# Patient Record
Sex: Female | Born: 1950
Health system: Southern US, Community
[De-identification: ages and names within clinical notes are randomized; demographics above are authoritative.]

## PROBLEM LIST (undated history)

## (undated) DIAGNOSIS — F419 Anxiety disorder, unspecified: Secondary | ICD-10-CM

## (undated) DIAGNOSIS — E7800 Pure hypercholesterolemia, unspecified: Secondary | ICD-10-CM

## (undated) DIAGNOSIS — R0602 Shortness of breath: Secondary | ICD-10-CM

## (undated) DIAGNOSIS — M1990 Unspecified osteoarthritis, unspecified site: Secondary | ICD-10-CM

## (undated) DIAGNOSIS — I471 Supraventricular tachycardia, unspecified: Secondary | ICD-10-CM

## (undated) DIAGNOSIS — I10 Essential (primary) hypertension: Secondary | ICD-10-CM

## (undated) DIAGNOSIS — J45909 Unspecified asthma, uncomplicated: Secondary | ICD-10-CM

## (undated) DIAGNOSIS — J449 Chronic obstructive pulmonary disease, unspecified: Secondary | ICD-10-CM

## (undated) HISTORY — PX: CARDIAC CATHETERIZATION: SHX172

## (undated) HISTORY — DX: Pure hypercholesterolemia, unspecified: E78.00

## (undated) HISTORY — PX: DILATION AND CURETTAGE OF UTERUS: SHX78

## (undated) HISTORY — DX: Chronic obstructive pulmonary disease, unspecified: J44.9

## (undated) HISTORY — PX: TUBAL LIGATION: SHX77

## (undated) HISTORY — DX: Supraventricular tachycardia, unspecified: I47.10

## (undated) HISTORY — DX: Essential (primary) hypertension: I10

## (undated) HISTORY — PX: HYSTEROSCOPY: SHX211

## (undated) HISTORY — PX: HERNIA REPAIR: SHX51

## (undated) HISTORY — DX: Supraventricular tachycardia: I47.1

---

## 1998-11-18 ENCOUNTER — Other Ambulatory Visit: Admission: RE | Admit: 1998-11-18 | Discharge: 1998-11-18 | Payer: Self-pay | Admitting: Obstetrics and Gynecology

## 1998-12-09 ENCOUNTER — Emergency Department (HOSPITAL_COMMUNITY): Admission: EM | Admit: 1998-12-09 | Discharge: 1998-12-10 | Payer: Self-pay

## 1998-12-10 ENCOUNTER — Encounter: Payer: Self-pay | Admitting: Emergency Medicine

## 1998-12-10 ENCOUNTER — Ambulatory Visit: Admission: RE | Admit: 1998-12-10 | Discharge: 1998-12-10 | Payer: Self-pay | Admitting: Emergency Medicine

## 1998-12-19 ENCOUNTER — Other Ambulatory Visit: Admission: RE | Admit: 1998-12-19 | Discharge: 1998-12-19 | Payer: Self-pay | Admitting: Obstetrics and Gynecology

## 1999-12-06 ENCOUNTER — Encounter: Admission: RE | Admit: 1999-12-06 | Discharge: 1999-12-06 | Payer: Self-pay | Admitting: Obstetrics and Gynecology

## 1999-12-06 ENCOUNTER — Encounter: Payer: Self-pay | Admitting: Obstetrics and Gynecology

## 1999-12-20 ENCOUNTER — Other Ambulatory Visit: Admission: RE | Admit: 1999-12-20 | Discharge: 1999-12-20 | Payer: Self-pay | Admitting: Obstetrics and Gynecology

## 2002-03-18 ENCOUNTER — Encounter: Admission: RE | Admit: 2002-03-18 | Discharge: 2002-03-18 | Payer: Self-pay | Admitting: Dermatology

## 2002-03-18 ENCOUNTER — Encounter: Payer: Self-pay | Admitting: Family Medicine

## 2002-04-21 ENCOUNTER — Ambulatory Visit (HOSPITAL_COMMUNITY): Admission: RE | Admit: 2002-04-21 | Discharge: 2002-04-21 | Payer: Self-pay | Admitting: Family Medicine

## 2002-04-21 ENCOUNTER — Encounter: Payer: Self-pay | Admitting: Family Medicine

## 2003-02-16 ENCOUNTER — Other Ambulatory Visit: Admission: RE | Admit: 2003-02-16 | Discharge: 2003-02-16 | Payer: Self-pay | Admitting: *Deleted

## 2003-03-19 ENCOUNTER — Ambulatory Visit (HOSPITAL_COMMUNITY): Admission: RE | Admit: 2003-03-19 | Discharge: 2003-03-19 | Payer: Self-pay | Admitting: *Deleted

## 2003-03-19 ENCOUNTER — Encounter (INDEPENDENT_AMBULATORY_CARE_PROVIDER_SITE_OTHER): Payer: Self-pay | Admitting: *Deleted

## 2003-03-22 ENCOUNTER — Encounter: Payer: Self-pay | Admitting: Family Medicine

## 2003-03-22 ENCOUNTER — Encounter: Admission: RE | Admit: 2003-03-22 | Discharge: 2003-03-22 | Payer: Self-pay | Admitting: Family Medicine

## 2003-12-20 ENCOUNTER — Ambulatory Visit (HOSPITAL_COMMUNITY): Admission: RE | Admit: 2003-12-20 | Discharge: 2003-12-20 | Payer: Self-pay | Admitting: *Deleted

## 2003-12-20 ENCOUNTER — Encounter (INDEPENDENT_AMBULATORY_CARE_PROVIDER_SITE_OTHER): Payer: Self-pay | Admitting: Specialist

## 2004-03-16 ENCOUNTER — Encounter: Admission: RE | Admit: 2004-03-16 | Discharge: 2004-03-16 | Payer: Self-pay | Admitting: *Deleted

## 2004-06-13 ENCOUNTER — Ambulatory Visit (HOSPITAL_BASED_OUTPATIENT_CLINIC_OR_DEPARTMENT_OTHER): Admission: RE | Admit: 2004-06-13 | Discharge: 2004-06-13 | Payer: Self-pay

## 2004-06-13 ENCOUNTER — Ambulatory Visit (HOSPITAL_COMMUNITY): Admission: RE | Admit: 2004-06-13 | Discharge: 2004-06-13 | Payer: Self-pay

## 2005-10-01 ENCOUNTER — Encounter: Admission: RE | Admit: 2005-10-01 | Discharge: 2005-10-01 | Payer: Self-pay | Admitting: Family Medicine

## 2006-11-18 ENCOUNTER — Encounter: Admission: RE | Admit: 2006-11-18 | Discharge: 2006-11-18 | Payer: Self-pay | Admitting: Family Medicine

## 2007-01-23 ENCOUNTER — Ambulatory Visit: Payer: Self-pay | Admitting: Internal Medicine

## 2007-01-31 ENCOUNTER — Ambulatory Visit: Payer: Self-pay | Admitting: Internal Medicine

## 2010-11-28 ENCOUNTER — Other Ambulatory Visit (HOSPITAL_COMMUNITY): Payer: Self-pay | Admitting: Family Medicine

## 2010-11-28 DIAGNOSIS — M545 Low back pain, unspecified: Secondary | ICD-10-CM

## 2010-11-28 DIAGNOSIS — R200 Anesthesia of skin: Secondary | ICD-10-CM

## 2010-12-04 ENCOUNTER — Other Ambulatory Visit (HOSPITAL_COMMUNITY): Payer: Self-pay

## 2010-12-07 ENCOUNTER — Ambulatory Visit (HOSPITAL_COMMUNITY)
Admission: RE | Admit: 2010-12-07 | Discharge: 2010-12-07 | Disposition: A | Discharge status: Home / Self Care | Payer: Self-pay | Source: Ambulatory Visit | Attending: Family Medicine | Admitting: Family Medicine

## 2010-12-07 DIAGNOSIS — R05 Cough: Secondary | ICD-10-CM | POA: Insufficient documentation

## 2010-12-07 DIAGNOSIS — J988 Other specified respiratory disorders: Secondary | ICD-10-CM | POA: Insufficient documentation

## 2010-12-07 DIAGNOSIS — R059 Cough, unspecified: Secondary | ICD-10-CM | POA: Insufficient documentation

## 2010-12-07 DIAGNOSIS — R062 Wheezing: Secondary | ICD-10-CM | POA: Insufficient documentation

## 2010-12-07 DIAGNOSIS — R0609 Other forms of dyspnea: Secondary | ICD-10-CM | POA: Insufficient documentation

## 2010-12-07 DIAGNOSIS — R0989 Other specified symptoms and signs involving the circulatory and respiratory systems: Secondary | ICD-10-CM | POA: Insufficient documentation

## 2010-12-07 DIAGNOSIS — Z87891 Personal history of nicotine dependence: Secondary | ICD-10-CM | POA: Insufficient documentation

## 2010-12-29 NOTE — Op Note (Signed)
NAME:  Brandy Scott, Brandy Scott              ACCOUNT NO.:  [id number]   MEDICAL RECORD NO.:  [id number]          PATIENT TYPE:  AMB   LOCATION:  [facility]                         FACILITY:  [facility]   PHYSICIAN:  Lorre Munroe., M.D.DATE OF BIRTH:  May 19, 1951   DATE OF PROCEDURE:  06/13/2004  DATE OF DISCHARGE:                                 OPERATIVE REPORT   PREOPERATIVE DIAGNOSES:  Umbilical hernia.   POSTOPERATIVE DIAGNOSES:  Umbilical hernia.   OPERATION:  Repair of umbilical hernia.   SURGEON:  Lebron Conners, M.D.   ANESTHESIA:  General and local.   DESCRIPTION OF PROCEDURE:  After the patient was monitored and had general  anesthesia and routine preparation and draping of the abdomen, I made a  short transverse incision just below the umbilicus over the palpable hernia.  After getting hemostasis in the skin and superficial subcutaneous tissues, I  separated the hernia sac and its contents from the surrounding subcutaneous  tissues and umbilical skin.  Then I slightly enlarged the whole in the  fascia and reduced the hernia contents. I could not tell whether the  contents were entirely preperitoneal fat but there was some appearance to me  of omentum as well.  I then sutured closed the hernia defect with running #0  Prolene suture.  I then mobilized the umbilical skin and surrounding  subcutaneous tissues off the repair for about 2 cm in all directions. I  fashioned a small patch of polypropylene mesh to fit over the repair and I  sewed that down with running basting 2-0 Prolene suture and I felt that this  repair reinforcement was adequate. I then used a long acting local  anesthetic to anesthetize the deep and superficial tissues. I sewed the  umbilical skin down to the central part of the mesh repair with a single  suture of 4-0 Vicryl and then closed the skin incision with running  intracuticular 4-0 Vicryl reinforced by Steri-Strips.  I applied a bulky  slightly compressive  bandage. The patient tolerated the operation well.      WB/MEDQ  D:  06/13/2004  T:  06/13/2004  Job:  [id number]

## 2010-12-29 NOTE — H&P (Signed)
NAME:  Brandy Scott, STANGELO                        ACCOUNT NO.:  [id number]   MEDICAL RECORD NO.:  [id number]                   PATIENT TYPE:  AMB   LOCATION:  [facility]                                  FACILITY:  [facility]   PHYSICIAN:  Achille B. Earlene Plater, M.D.               DATE OF BIRTH:  Apr 20, 1951   DATE OF ADMISSION:  DATE OF DISCHARGE:                                HISTORY & PHYSICAL   CHIEF COMPLAINT:  Postmenopausal bleeding.   HISTORY OF PRESENT ILLNESS:  A 60 year old white female initially seen at  the request of Dr. Laurann Montana for evaluation of postmenopausal bleeding.  Had been menopausal for the last 18 months and over the last six weeks  developed irregular bleeding.  Had no associated pain or other aggravating  or alleviating factors noted.  Amount of bleeding was mild.   Pelvic ultrasound in the office showed a thickened endometrial stripe  without focal mass.  Endometrial biopsy showed benign proliferative  endometrium.  Given the thickening of the endometrium, saline sonogram was  performed which shows a 1.4 cm apparent fundal endometrial polyp.  The  patient presents today for hysteroscopy D&C for further evaluation and  potential treatment.   PAST MEDICAL HISTORY:  Hypertension, cervical polyp, and obesity.   SURGICAL HISTORY:  Tubal ligation.   MEDICATIONS:  Maxzide and atenolol.   ALLERGIES:  None.   SOCIAL HISTORY:  Less than one-half pack per day smoker.  No alcohol or  other drugs.   FAMILY HISTORY:  Noncontributory.   REVIEW OF SYSTEMS:  Otherwise negative.   PHYSICAL EXAMINATION:  VITAL SIGNS:  Blood pressure 114/80, pulse 72, weight  257.25.  GENERAL:  Alert and oriented, no acute distress.  SKIN:  Warm and dry, no lesions.  HEART:  Regular rate and rhythm.  LUNGS:  Clear to auscultation.  ABDOMEN:  Liver and spleen normal, no hernia.  LYMPH NODE SURVEY:  Negative axilla and groin.  PELVIC:  Normal external genitalia, vagina and cervix normal.  Uterus  is  normal size.  It sounded to 7 cm on endometrial biopsy.  No adnexal masses  palpable.   Ultrasound results as outlined above.   ASSESSMENT:  Postmenopausal bleeding, suggestion of endometrial polyp on  saline infusion ultrasound.   PLAN:  Hysteroscopy D&C and polyp removal.  Operative risks discussed  including infection, bleeding, uterine perforation, and damage to  surrounding organs.  All questions answered; the patient wishes to proceed.                                               Gerri Spore B. Earlene Plater, M.D.    WBD/MEDQ  D:  03/16/2003  T:  03/16/2003  Job:  [id number]   cc:   Stacie Acres. White, M.D.  [address]  [address]  [place]  [place] [address]  Fax: [phone]

## 2010-12-29 NOTE — H&P (Signed)
NAME:  Brandy Scott, RAUTH                        ACCOUNT NO.:  [id number]   MEDICAL RECORD NO.:  [id number]                   PATIENT TYPE:  AMB   LOCATION:  [facility]                                  FACILITY:  [facility]   PHYSICIAN:  Patterson B. Earlene Plater, M.D.               DATE OF BIRTH:  [birth year]   DATE OF ADMISSION:  DATE OF DISCHARGE:                                HISTORY & PHYSICAL   DATE OF PROCEDURE:  Dec 20, 2003   PREOPERATIVE DIAGNOSIS:  Postmenopausal bleeding, history of endometrial  polyp, ultrasound suggestive of another endometrial polyp.   INTENDED PROCEDURE:  Hysteroscopy with polyp removal.   HISTORY OF PRESENT ILLNESS:  A 60 year old postmenopausal patient with a  history of recent episode of bleeding.  Ultrasound in the office suggestive  of an isolated endometrial polyp.  She has a history of the same in the past  and defers saline infusion ultrasound, rather to proceed directly with  hysteroscopy.   PAST MEDICAL HISTORY:  1. Hypertension.  2. Endometrial polyps.  3. Obesity.   SURGICAL HISTORY:  1. Tubal ligation.  2. Hysteroscopy.   MEDICATIONS:  1. Maxzide.  2. Atenolol.   ALLERGIES:  None.   SOCIAL HISTORY:  The patient continues to smoke.  No alcohol or other drugs.   FAMILY HISTORY:  Noncontributory.   REVIEW OF SYSTEMS:  Otherwise negative.   PHYSICAL EXAMINATION:  VITAL SIGNS:  Blood pressure 136/86, pulse 72, weight  253.  GENERAL:  Alert and oriented, no acute distress.  SKIN:  Warm and dry, no lesions.  HEART:  Regular rate and rhythm.  LUNGS:  Clear to auscultation.  ABDOMEN:  Liver and spleen normal, no hernia.  PELVIC:  Normal external genitalia, vagina and cervix normal.  The cervix is  mildly stenotic.  The uterus is nontender and anteverted, normal size, no  adnexal masses.   ASSESSMENT:  Postmenopausal bleeding, history of endometrial polyp.   PLAN:  Hysteroscopy with polyp removal.  Operative risks discussed including  infection;  bleeding; damage to bowel, bladder, surrounding organs; and fluid  overload.  All questions answered.  The patient wishes to proceed.                                               Gerri Spore B. Earlene Plater, M.D.    WBD/MEDQ  D:  12/17/2003  T:  12/17/2003  Job:  [id number]

## 2010-12-29 NOTE — Op Note (Signed)
NAME:  Brandy Scott, Brandy Scott                        ACCOUNT NO.:  [id number]   MEDICAL RECORD NO.:  [id number]                   PATIENT TYPE:  AMB   LOCATION:  [facility]                                  [facility]ACILITY:  [facility]   PHYSICIAN:  Sabin B. Earlene Plater, M.D.               DATE OF BIRTH:  1951-02-09   DATE OF PROCEDURE:  12/20/2003  DATE OF DISCHARGE:                                 OPERATIVE REPORT   PREOPERATIVE DIAGNOSES:  1. Postmenopausal bleeding.  2. Possible endometrial polyp.   POSTOPERATIVE DIAGNOSES:  1. Postmenopausal bleeding.  2. Possible endometrial polyp.   PROCEDURE:  Hysteroscopy D&C, polyp removal.   SURGEON:  Benton B. Earlene Plater, M.D.   ANESTHESIA:  LMA general.   FINDINGS:  Endometrial curettings.   FLUID DEFICIT:  20 mL Sorbitol.   ESTIMATED BLOOD LOSS:  Less than 50 mL.   COMPLICATIONS:  None.   INDICATIONS FOR PROCEDURE:  Patient with history of postmenopausal bleeding  and previous history of endometrial polyp. Ultrasound suggested a recurrent  endometrial polyp.   DESCRIPTION OF PROCEDURE:  The patient taken to the operating room and LMA  general anesthesia obtained.  She was placed in the [place] stirrups and  prepped and draped in the standard fashion.  Bladder emptied with red rubber  catheter.   Examination under anesthesia showed anteverted uterus that did not feel  enlarged.  There were no adnexal masses palpable.  Examination was somewhat  limited by her morbid obesity.   Speculum inserted.  Paracervical block placed with 10 mL 1% Nesacaine.   Single-tooth tenaculum was attached to the anterior lip of the cervix and  the cervix easily dilated to #21.   The diagnostic hysteroscope was inserted after being flushed with Sorbitol.  With good uterine distention, the cavity was inspected with the above  findings noted.   The polyp was removed with the Randall stone forceps and the endometrium  gently curetted with a serrated curette.   The scope was  reinserted and no other focal abnormalities were identified.  Therefore the procedure was terminated.   The single-tooth tenaculum was removed and the cervix hemostatic.  The  patient was taken to the recovery room awake, alert and in stable condition.                                               Gerri Spore B. Earlene Plater, M.D.    WBD/MEDQ  D:  12/20/2003  T:  12/20/2003  Job:  [id number]

## 2010-12-29 NOTE — Op Note (Signed)
   NAME:  Brandy Scott, Brandy Scott                        ACCOUNT NO.:  [id number]   MEDICAL RECORD NO.:  [id number]                   PATIENT TYPE:  AMB   LOCATION:  [facility]                                  [facility]ACILITY:  [facility]   PHYSICIAN:  Berlin B. Earlene Plater, M.D.               DATE OF BIRTH:  June 12, 1951   DATE OF PROCEDURE:  03/19/2003  DATE OF DISCHARGE:                                 OPERATIVE REPORT   PREOPERATIVE DIAGNOSES:  1. Postmenopausal bleeding.  2. Endometrial polyp.   POSTOPERATIVE DIAGNOSES:  1. Postmenopausal bleeding.  2. Endometrial polyp.   PROCEDURE:  Hysteroscopy, dilatation and curettage, and polypectomy.   SURGEON:  Chester Holstein. Earlene Plater, M.D.   ANESTHESIA:  LMA general.   FINDINGS:  Atrophic endometrium and endometrial polyp.   SPECIMENS:  Endometrial polyp and endometrial curettings.   ESTIMATED BLOOD LOSS:  Less than 50 mL.   FLUID DEFICIT:  20 mL sorbitol.   COMPLICATIONS:  None.   INDICATIONS:  Patient with a history of postmenopausal bleeding.  Pelvic  ultrasound and subsequent saline infusion ultrasound suggestive of an  endometrial polyp.   DESCRIPTION OF PROCEDURE:  The patient was taken to the operating room and  LMA general anesthesia obtained.  She was placed in the ski position and  prepped and draped in the standard fashion.  Bladder entered with a red  rubber catheter.  Exam under anesthesia showed a normal-size mobile uterus,  no adnexal masses palpable.   Speculum inserted, single-tooth tenaculum attached to the anterior lip of  the cervix.  The cervix easily dilated to a #21.  The diagnostic  hysteroscope was inserted after being flushed with sorbitol with good  uterine distention.  A single endometrial polyp was noted to be arising from  the fundus in the midline.  No other focal abnormalities were seen.  The  polyp was removed with the Randall stone forceps and the endometrium gently  curetted and sent as a separate specimen.  The scope was  reinserted and no  other abnormalities noted; therefore, the procedure was terminated.   Instruments were removed, cervix was hemostatic.   The patient tolerated the procedure well.  There were no complications.  She  was taken to the recovery room awake, alert, and in stable condition.                                               Gerri Spore B. Earlene Plater, M.D.    WBD/MEDQ  D:  03/19/2003  T:  03/20/2003  Job:  [id number]

## 2011-03-14 ENCOUNTER — Encounter: Payer: Self-pay | Admitting: Internal Medicine

## 2011-03-15 ENCOUNTER — Encounter: Payer: Self-pay | Admitting: Internal Medicine

## 2011-03-15 ENCOUNTER — Ambulatory Visit (INDEPENDENT_AMBULATORY_CARE_PROVIDER_SITE_OTHER): Payer: Self-pay | Admitting: Internal Medicine

## 2011-03-15 VITALS — BP 126/88 | HR 70 | Temp 98.1°F | Ht 66.0 in | Wt 252.2 lb

## 2011-03-15 DIAGNOSIS — R0609 Other forms of dyspnea: Secondary | ICD-10-CM

## 2011-03-15 DIAGNOSIS — J449 Chronic obstructive pulmonary disease, unspecified: Secondary | ICD-10-CM | POA: Insufficient documentation

## 2011-03-15 DIAGNOSIS — R05 Cough: Secondary | ICD-10-CM | POA: Insufficient documentation

## 2011-03-15 DIAGNOSIS — R0989 Other specified symptoms and signs involving the circulatory and respiratory systems: Secondary | ICD-10-CM

## 2011-03-15 DIAGNOSIS — R059 Cough, unspecified: Secondary | ICD-10-CM

## 2011-03-15 DIAGNOSIS — R0600 Dyspnea, unspecified: Secondary | ICD-10-CM

## 2011-03-15 NOTE — Assessment & Plan Note (Signed)
Copd, GERD likely etiologies. Noted she is on fish oil. Will finish dyspnea workup and then reassess

## 2011-03-15 NOTE — Patient Instructions (Signed)
Please have breathing test called FULL PFT After you have test, call our office to let me know that test is done Within a week or so I will get back to you about results Depending on test results I will tell you to come in for visit or have a pulmonary stress test

## 2011-03-15 NOTE — Progress Notes (Signed)
Subjective:    Patient ID: Brandy Scott, female    DOB: [birth year], 60 y.o.   MRN: [id number]  HPI  60 year old obese female, ex 42 pack smoker (quit oct 2011). Since then reports insidious onset of dyspnea. Progressive since onset. Dyspnea brought on by exertional activities like walking to mail box and back on incline, finishing shower. No longer able to husband in  [place]. Always relieved by rest but now has dyspnea occasional at rest. Dyspnea rated as moderate.   There is associated cough. Insidious onset. STarted few years ago as dry cough even before she quit smoking. After quitting smoking, cough is not worse but now associaed with sputum which is normall white but occasionally with greenish tint. Admits to occ GERD for which she takes tums prn (she is on fish oil but denies this is provoking gerd). Denies sinus drainage. No ACE inhibitor use (is on ARB  - losartan).   There is also associated wheeze and chest heaviness but no hemoptysis, syncope, chest pains, edema, orthopnea, paroxysmal nocturnal dyspnea. Unsure if she snores at night but does admit to easy excess day time somnolence. Denies family hx of COPD/asthma  She had spirometry 12/07/2010 - suggests moderate restriction: Fev1 1.77L/61%, Ratio 74 (78), small airways 1.56/60%. However, there is 9%/210cc improvement on FVC with bronchodilator. Flow volume loops esp exp phase are blunted peak flow and bit erratci  Trials of spiriva and dulera each 1 month have not helped. CXR per hx of patient last month was normal. Walking desat test in office 185 feet x 3 laps - no desaturation.  Past Medical History  Diagnosis Date  . Hypercholesteremia   . HTN (hypertension)   . Diabetes mellitus   . Gout   . COPD (chronic obstructive pulmonary disease)      Family History  Problem Relation Age of Onset  . Hypertension Father   . Diabetes Father   . Parkinsonism Father   . Colon cancer Mother   . Alzheimer's disease Mother   .  Clotting disorder Mother     had filter placed     History   Social History  . Marital Status: Married    Spouse Name: N/A    Number of Children: 2  . Years of Education: N/A   Occupational History  . unemployed    Social History Main Topics  . Smoking status: Former Smoker -- 1.0 packs/day for 42 years    Types: Cigarettes    Quit date: 05/14/2010  . Smokeless tobacco: Not on file  . Alcohol Use: No  . Drug Use: No  . Sexually Active: Not on file   Other Topics Concern  . Not on file   Social History Narrative  . No narrative on file     No Known Allergies   Outpatient Prescriptions Prior to Visit  Medication Sig Dispense Refill  . aspirin 81 MG tablet Take 81 mg by mouth daily.        . fish oil-omega-3 fatty acids 1000 MG capsule Take 2 g by mouth daily.        . hydrochlorothiazide 25 MG tablet Take 25 mg by mouth daily.        Marland Kitchen losartan (COZAAR) 100 MG tablet Take 50 mg by mouth daily.        . metFORMIN (GLUCOPHAGE) 1000 MG tablet Take 1,000 mg by mouth daily with breakfast.        . Mometasone Furo-Formoterol Fum (DULERA) 200-5 MCG/ACT AERO  Inhale 2 puffs into the lungs 2 (two) times daily.                 Review of Systems  Constitutional: Negative for fever and unexpected weight change.  HENT: Positive for ear pain. Negative for nosebleeds, congestion, sore throat, rhinorrhea, sneezing, trouble swallowing, dental problem, postnasal drip and sinus pressure.   Eyes: Negative for redness and itching.  Respiratory: Positive for cough and shortness of breath. Negative for chest tightness and wheezing.   Cardiovascular: Positive for leg swelling. Negative for palpitations.  Gastrointestinal: Negative for nausea and vomiting.  Genitourinary: Negative for dysuria.  Musculoskeletal: Positive for joint swelling.  Skin: Negative for rash.  Neurological: Negative for headaches.  Hematological: Does not bruise/bleed easily.  Psychiatric/Behavioral: Negative  for dysphoric mood. The patient is not nervous/anxious.        Objective:   Physical Exam  Vitals reviewed. Constitutional: She is oriented to person, place, and time. She appears well-developed and well-nourished. No distress.       obese  HENT:  Head: Normocephalic and atraumatic.  Right Ear: External ear normal.  Left Ear: External ear normal.  Mouth/Throat: Oropharynx is clear and moist. No oropharyngeal exudate.  Eyes: Conjunctivae and EOM are normal. Pupils are equal, round, and reactive to light. Right eye exhibits no discharge. Left eye exhibits no discharge. No scleral icterus.  Neck: Normal range of motion. Neck supple. No JVD present. No tracheal deviation present. No thyromegaly present.  Cardiovascular: Normal rate, regular rhythm, normal heart sounds and intact distal pulses.  Exam reveals no gallop and no friction rub.   No murmur heard. Pulmonary/Chest: Effort normal and breath sounds normal. No respiratory distress. She has no wheezes. She has no rales. She exhibits no tenderness.  Abdominal: Soft. Bowel sounds are normal. She exhibits no distension and no mass. There is no tenderness. There is no rebound and no guarding.  Musculoskeletal: Normal range of motion. She exhibits no edema and no tenderness.  Lymphadenopathy:    She has no cervical adenopathy.  Neurological: She is alert and oriented to person, place, and time. She has normal reflexes. No cranial nerve deficit. She exhibits normal muscle tone. Coordination normal.  Skin: Skin is warm and dry. No rash noted. She is not diaphoretic. No erythema. No pallor.  Psychiatric: She has a normal mood and affect. Her behavior is normal. Judgment and thought content normal.          Assessment & Plan:

## 2011-03-15 NOTE — Assessment & Plan Note (Signed)
Hx is very c/w COPD but obesity is confounding her spirometry and showing restriction. Also, spiriva and dulera trials did not work. Therefore, will get full PFT and reasess. IF full PFT is not helping with answer, get CPST. We have to keep in mind medicaiton technique because patient herself questioned if this could be etiology

## 2011-03-20 ENCOUNTER — Telehealth: Payer: Self-pay | Admitting: Internal Medicine

## 2011-03-20 ENCOUNTER — Ambulatory Visit (INDEPENDENT_AMBULATORY_CARE_PROVIDER_SITE_OTHER): Payer: Self-pay | Admitting: Internal Medicine

## 2011-03-20 DIAGNOSIS — J449 Chronic obstructive pulmonary disease, unspecified: Secondary | ICD-10-CM

## 2011-03-20 DIAGNOSIS — R0600 Dyspnea, unspecified: Secondary | ICD-10-CM

## 2011-03-20 LAB — PULMONARY FUNCTION TEST

## 2011-03-20 NOTE — Telephone Encounter (Signed)
Please advise MR pt is requesting her PFT results. Thanks  Carver Fila, CMA

## 2011-03-20 NOTE — Telephone Encounter (Signed)
PFTs given to MR. Carron Curie, CMA

## 2011-03-20 NOTE — Telephone Encounter (Signed)
pls get it from Jerolyn Shin or have her bring it to me or leave it on my desk in office for reiew

## 2011-03-20 NOTE — Progress Notes (Signed)
PFT done today. 

## 2011-03-20 NOTE — Telephone Encounter (Signed)
PFT 03/20/2011 is esentiallly normal. There is some suggestion of asthma but unclear why the dulera did not help in past. Therefore, do CPST on bike test with EIB challenge (ordered) with Mr Laymond Purser and then come back for fu

## 2011-03-21 NOTE — Telephone Encounter (Signed)
Spoke with pt and notified of recs per MR. She verbalized understanding and denied any further questions.

## 2011-04-02 ENCOUNTER — Ambulatory Visit (HOSPITAL_COMMUNITY): Payer: Self-pay | Attending: Internal Medicine

## 2011-04-02 DIAGNOSIS — R0989 Other specified symptoms and signs involving the circulatory and respiratory systems: Secondary | ICD-10-CM | POA: Insufficient documentation

## 2011-04-02 DIAGNOSIS — R0609 Other forms of dyspnea: Secondary | ICD-10-CM | POA: Insufficient documentation

## 2011-04-02 DIAGNOSIS — R0600 Dyspnea, unspecified: Secondary | ICD-10-CM

## 2011-04-04 ENCOUNTER — Telehealth: Payer: Self-pay | Admitting: Internal Medicine

## 2011-04-04 DIAGNOSIS — R0600 Dyspnea, unspecified: Secondary | ICD-10-CM

## 2011-04-04 NOTE — Telephone Encounter (Signed)
Due to epic migration ast CPST labe this is not loaded up yet. I will get back to her mid- next week. Meanwhile, Laymond Purser wants an order I have placed one which [facility] needs to fill out the detailed form and send it to [employer] to sender with cc to [facility]

## 2011-04-04 NOTE — Telephone Encounter (Signed)
Pt aware. Brandy Scott, CMA  

## 2011-04-04 NOTE — Telephone Encounter (Signed)
Will forward to MR to be on the look out for same.

## 2011-04-10 ENCOUNTER — Telehealth: Payer: Self-pay | Admitting: Internal Medicine

## 2011-04-10 ENCOUNTER — Ambulatory Visit (HOSPITAL_COMMUNITY): Payer: Self-pay

## 2011-04-10 NOTE — Telephone Encounter (Signed)
Pt is requesting results of CPST. Please advise. Carron Curie, CMA

## 2011-04-11 NOTE — Telephone Encounter (Signed)
reviwed result of CPST  Dyspnea due to A. Asthma -exercise induced B. Weight  Plan Please give first available appt to come in and discuss

## 2011-04-12 NOTE — Telephone Encounter (Signed)
[email or web address]  

## 2011-04-12 NOTE — Telephone Encounter (Signed)
Pt aware of cpst results. Pt is scheduled to come in 04/30/11 at 2:45

## 2011-04-30 ENCOUNTER — Ambulatory Visit (INDEPENDENT_AMBULATORY_CARE_PROVIDER_SITE_OTHER): Payer: Self-pay | Admitting: Internal Medicine

## 2011-04-30 ENCOUNTER — Encounter: Payer: Self-pay | Admitting: Internal Medicine

## 2011-04-30 VITALS — BP 146/78 | HR 93 | Temp 98.4°F | Ht 66.5 in | Wt 250.2 lb

## 2011-04-30 DIAGNOSIS — R0600 Dyspnea, unspecified: Secondary | ICD-10-CM

## 2011-04-30 DIAGNOSIS — R0989 Other specified symptoms and signs involving the circulatory and respiratory systems: Secondary | ICD-10-CM

## 2011-04-30 DIAGNOSIS — R0602 Shortness of breath: Secondary | ICD-10-CM

## 2011-04-30 NOTE — Progress Notes (Signed)
Subjective:    Patient ID: Brandy Scott, female    DOB: February 05, 1951, 61 y.o.   MRN: [id number]  HPI  60 year old obese female, ex 42 pack smoker (quit oct 2011). Since quitting smoking (no change in weight), reports insidious onset of dyspnea. Progressive since onset. Dyspnea brought on by exertional activities like walking to mail box and back on incline, finishing shower. No longer able to help husband in  [place]. Always relieved by rest but now has dyspnea occasional at rest. Dyspnea rated as moderate.   There is associated cough. Insidious onset. STarted few years ago as dry cough even before she quit smoking. After quitting smoking, cough is not worse but now associaed with sputum which is normall white but occasionally with greenish tint. Admits to occ GERD for which she takes tums prn (she is on fish oil but denies this is provoking gerd). Denies sinus drainage. No ACE inhibitor use (is on ARB  - losartan).   There is also associated wheeze esp at night and chest heaviness but no hemoptysis, syncope, chest pains, edema, orthopnea, paroxysmal nocturnal dyspnea. Unsure if she snores at night but does admit to easy excess day time somnolence. Denies family hx of COPD/asthma  She had spirometry 12/07/2010 - suggests moderate restriction: Fev1 1.77L/61%, Ratio 74 (78), small airways 1.56/60%. However, there is 9%/210cc improvement on FVC with bronchodilator. Flow volume loops esp exp phase are blunted peak flow and bit erratci  Trials of spiriva and dulera each 1 month have not helped. CXR per hx of patient last month was normal. Walking desat test in office 185 feet x 3 laps - no desaturation.    Ov 04/30/11: [place] dyspnea after CPST. Here with husband. Reports persistence of above dyspnea, cough, wheeze. Denies chest pain last visit and today. However, husband reminded her that she on occasion has had some chest pain lower sternum with radiation under both breasts. This happens on times when  dyspnea is extreme or exertion in extreme. Relieved by rest. Rates chest pain as mild-moderate. Overall dyspnea is unchanged to a maybe a bit worse since before. Getting dyspneic with vaccumming (stops after 1 room), bending over or cleaning house; examples of she is worse since last visit. She recollects that spiriva and dulera (tried them separately each for 1 month) did not help. However, she now states that she was using dulera only 2 puff once daily. No new problems.   CPST in august 2012 reviewed: dyspnea due to obesity but also strongly positive for asthma. In addition, at rest she had diffuse T wave inversion and had hypertensive response. I think she obesity, asthma, hypertensive response and ? Occult CAD are contributing to dyspnea   Review of Systems Review of Systems  Constitutional: Negative for fever and unexpected weight change.  HENT: Positive for ear pain. Negative for nosebleeds, congestion, sore throat, rhinorrhea, sneezing, trouble swallowing, dental problem, postnasal drip and sinus pressure.   Eyes: Negative for redness and itching.  Respiratory: Positive for cough and shortness of breath. Negative for chest tightness and wheezing.   Cardiovascular: Positive for leg swelling. Negative for palpitations.  Gastrointestinal: Negative for nausea and vomiting.  Genitourinary: Negative for dysuria.  Musculoskeletal: Positive for joint swelling.  Skin: Negative for rash.  Neurological: Negative for headaches.  Hematological: Does not bruise/bleed easily.  Psychiatric/Behavioral: Negative for dysphoric mood. The patient is not nervous/anxious.       Objective:   Physical Exam  Physical Exam  Vitals reviewed. Constitutional: She  is oriented to person, place, and time. She appears well-developed and well-nourished. No distress.       obese  HENT:  Head: Normocephalic and atraumatic.  Right Ear: External ear normal.  Left Ear: External ear normal.  Mouth/Throat: Oropharynx  is clear and moist. No oropharyngeal exudate.  Eyes: Conjunctivae and EOM are normal. Pupils are equal, round, and reactive to light. Right eye exhibits no discharge. Left eye exhibits no discharge. No scleral icterus.  Neck: Normal range of motion. Neck supple. No JVD present. No tracheal deviation present. No thyromegaly present.  Cardiovascular: Normal rate, regular rhythm, normal heart sounds and intact distal pulses.  Exam reveals no gallop and no friction rub.   No murmur heard. Pulmonary/Chest: Effort normal and breath sounds normal. No respiratory distress. She has no wheezes. She has no rales. She exhibits no tenderness.  Abdominal: Soft. Bowel sounds are normal. She exhibits no distension and no mass. There is no tenderness. There is no rebound and no guarding.  Musculoskeletal: Normal range of motion. She exhibits no edema and no tenderness.  Lymphadenopathy:    She has no cervical adenopathy.  Neurological: She is alert and oriented to person, place, and time. She has normal reflexes. No cranial nerve deficit. She exhibits normal muscle tone. Coordination normal.  Skin: Skin is warm and dry. No rash noted. She is not diaphoretic. No erythema. No pallor.  Psychiatric: She has a normal mood and affect. Her behavior is normal. Judgment and thought content normal.         Assessment & Plan:

## 2011-04-30 NOTE — Patient Instructions (Signed)
Your shortness of breath on test is due to high bp, possibly T wave inversion playing a role, asthma and possibly from underlying weight issues Please see cardiology - Dr Clifton James or Dr Excell Seltzer or Dr Shirlee Latch asap Please start symbicort samples 2 puff tiwce daily  - take 2 samples Please take albuterol 2 puff as needed Return in 4 weeks to discuss further

## 2011-05-02 ENCOUNTER — Encounter: Payer: Self-pay | Admitting: Cardiovascular Disease

## 2011-05-02 ENCOUNTER — Ambulatory Visit (INDEPENDENT_AMBULATORY_CARE_PROVIDER_SITE_OTHER): Payer: Self-pay | Admitting: Cardiovascular Disease

## 2011-05-02 ENCOUNTER — Encounter: Payer: Self-pay | Admitting: Internal Medicine

## 2011-05-02 ENCOUNTER — Encounter: Payer: Self-pay | Admitting: *Deleted

## 2011-05-02 DIAGNOSIS — R0600 Dyspnea, unspecified: Secondary | ICD-10-CM

## 2011-05-02 DIAGNOSIS — R002 Palpitations: Secondary | ICD-10-CM | POA: Insufficient documentation

## 2011-05-02 DIAGNOSIS — R079 Chest pain, unspecified: Secondary | ICD-10-CM | POA: Insufficient documentation

## 2011-05-02 DIAGNOSIS — R0609 Other forms of dyspnea: Secondary | ICD-10-CM

## 2011-05-02 DIAGNOSIS — R0989 Other specified symptoms and signs involving the circulatory and respiratory systems: Secondary | ICD-10-CM

## 2011-05-02 LAB — BASIC METABOLIC PANEL
BUN: 10 mg/dL (ref 6–23)
CO2: 29 mEq/L (ref 19–32)
Calcium: 9.4 mg/dL (ref 8.4–10.5)
Chloride: 94 mEq/L — ABNORMAL LOW (ref 96–112)
Creatinine, Ser: 0.7 mg/dL (ref 0.4–1.2)
GFR: 85.21 mL/min (ref >60.00)
Glucose, Bld: 159 mg/dL — ABNORMAL HIGH (ref 70–99)
Potassium: 3 mEq/L — ABNORMAL LOW (ref 3.5–5.1)
Sodium: 136 mEq/L (ref 135–145)

## 2011-05-02 LAB — PROTIME-INR
INR: 1 ratio (ref 0.8–1.0)
Prothrombin Time: 11.6 s (ref 10.2–12.4)

## 2011-05-02 NOTE — Patient Instructions (Signed)
Your physician recommends that you schedule a follow-up appointment in: 3 weeks Your physician recommends that you return for lab work in: today Your physician has requested that you have a cardiac catheterization. Cardiac catheterization is used to diagnose and/or treat various heart conditions. Doctors may recommend this procedure for a number of different reasons. The most common reason is to evaluate chest pain. Chest pain can be a symptom of coronary artery disease (CAD), and cardiac catheterization can show whether plaque is narrowing or blocking your heart's arteries. This procedure is also used to evaluate the valves, as well as measure the blood flow and oxygen levels in different parts of your heart. For further information please visit [email or web address] Please follow instruction sheet, as given.

## 2011-05-02 NOTE — Assessment & Plan Note (Signed)
See above. Will exclude cardiac cause.

## 2011-05-02 NOTE — Assessment & Plan Note (Signed)
Exertional chest pain and dyspnea in a 60 yo patient with Diabetes mellitus, HTN, HLD and long history of tobacco abuse. She is at high risk for CAD given her risk factors and symptoms. I intiially recommended stress testing and echocardiogram however she has no insurance. She has decided to proceed directly to a left heart catheterization. I have explained the risks and benefits. She agrees to proceed. No loud heart murmurs so will defer echo for now given cost. We will get assessment of LV function with cardiac cath.

## 2011-05-02 NOTE — Progress Notes (Signed)
History of Present Illness:60 yo WF with h/o HTN, HLD, DM, Gout, asthma here today for cardiac evaluation. She recently saw Dr. Marchelle Gearing in the pulmonary clinic and c/o substernal chest pressure with dyspnea. She has no prior cardiac history. She has no family history of CAD. She tells me that she has had dyspnea with minimal exertion for one year. When the dyspnea resolves, she feels a pressure across both breasts. She also notes a fluttering in her chest. Lasts for a few minutes. She also notes periods of tachycardia. Recent cardiopulmonary stress test without chest pain. No dizziness, near syncope or syncope.   Past Medical History  Diagnosis Date  . Hypercholesteremia   . HTN (hypertension)   . Diabetes mellitus   . Gout   . COPD (chronic obstructive pulmonary disease)     Past Surgical History  Procedure Date  . Hernia repair   . Hysteroscopy   . Dilation and curettage of uterus   . Tubal ligation     Current Outpatient Prescriptions  Medication Sig Dispense Refill  . aspirin 81 MG tablet Take 81 mg by mouth daily.        . fish oil-omega-3 fatty acids 1000 MG capsule Take 2 g by mouth daily.        . hydrochlorothiazide 25 MG tablet Take 25 mg by mouth daily.        Marland Kitchen losartan (COZAAR) 100 MG tablet Take 50 mg by mouth daily.        . metFORMIN (GLUCOPHAGE) 1000 MG tablet Take 1,000 mg by mouth daily with breakfast.          No Known Allergies  History   Social History  . Marital Status: Married    Spouse Name: N/A    Number of Children: 2  . Years of Education: N/A   Occupational History  . unemployed    Social History Main Topics  . Smoking status: Former Smoker -- 1.0 packs/day for 42 years    Types: Cigarettes    Quit date: 05/14/2010  . Smokeless tobacco: Never Used  . Alcohol Use: No  . Drug Use: No  . Sexually Active: Not on file   Other Topics Concern  . Not on file   Social History Narrative  . No narrative on file    Family History  Problem  Relation Age of Onset  . Hypertension Father   . Diabetes Father   . Parkinsonism Father   . Colon cancer Mother   . Alzheimer's disease Mother   . Clotting disorder Mother     had filter placed    Review of Systems:  As stated in the HPI and otherwise negative.   BP 132/80  Pulse 93  Ht 5' 6.5" (1.689 m)  Wt 247 lb (112.038 kg)  BMI 39.27 kg/m2  Physical Examination: General: Well developed, well nourished, NAD HEENT: OP clear, mucus membranes moist SKIN: warm, dry. No rashes. Neuro: No focal deficits Musculoskeletal: Muscle strength 5/5 all ext Psychiatric: Mood and affect normal Neck: No JVD, no carotid bruits, no thyromegaly, no lymphadenopathy. Lungs:Clear bilaterally, no wheezes, rhonci, crackles Cardiovascular: Regular rate and rhythm. No murmurs, gallops or rubs. Abdomen:Soft. Bowel sounds present. Non-tender.  Extremities: No lower extremity edema. Pulses are 2 + in the bilateral DP/PT.  EKG:NSR, rate T wave inversion diffusely.

## 2011-05-02 NOTE — Assessment & Plan Note (Signed)
Will have her wear a 48 hour Holter monitor.

## 2011-05-02 NOTE — Assessment & Plan Note (Addendum)
CPST reviewed: dyspnea due to obesity but also strongly positive for asthma. In addition, at rest she had diffuse T wave inversion and had hypertensive response. I think she obesity, asthma, hypertensive response and ? Occult CAD are contributing to dyspnea   PLAN  Your shortness of breath on test is due to high bp, possibly T wave inversion playing a role, asthma and possibly from underlying weight issues Please see cardiology - Dr Clifton James or Dr Excell Seltzer or Dr Shirlee Latch asap Please start symbicort samples 2 puff tiwce daily  - take 2 samples (no insurance, will  Help with samples and phone mgmt) Please take albuterol 2 puff as needed Return in 4 weeks to discuss further  > 25 min visit. > 50% of time in face to face counseling

## 2011-05-03 ENCOUNTER — Telehealth: Payer: Self-pay | Admitting: *Deleted

## 2011-05-03 DIAGNOSIS — R079 Chest pain, unspecified: Secondary | ICD-10-CM

## 2011-05-03 LAB — CBC WITH DIFFERENTIAL/PLATELET
Basophils Absolute: 0 10*3/uL (ref 0.0–0.1)
Basophils Relative: 0.3 % (ref 0.0–3.0)
Eosinophils Absolute: 0.3 10*3/uL (ref 0.0–0.7)
Eosinophils Relative: 2.8 % (ref 0.0–5.0)
HCT: 43.8 % (ref 36.0–46.0)
Hemoglobin: 14.9 g/dL (ref 12.0–15.0)
Lymphocytes Relative: 30.5 % (ref 12.0–46.0)
Lymphs Abs: 2.9 10*3/uL (ref 0.7–4.0)
MCHC: 34 g/dL (ref 30.0–36.0)
MCV: 87.8 fl (ref 78.0–100.0)
Monocytes Absolute: 0.7 10*3/uL (ref 0.1–1.0)
Monocytes Relative: 7.8 % (ref 3.0–12.0)
Neutro Abs: 5.5 10*3/uL (ref 1.4–7.7)
Neutrophils Relative %: 58.6 % (ref 43.0–77.0)
Platelets: 176 10*3/uL (ref 150.0–400.0)
RBC: 4.98 Mil/uL (ref 3.87–5.11)
RDW: 12.7 % (ref 11.5–14.6)
WBC: 9.4 10*3/uL (ref 4.5–10.5)

## 2011-05-03 MED ORDER — POTASSIUM CHLORIDE CRYS ER 20 MEQ PO TBCR: EXTENDED_RELEASE_TABLET | ORAL | Status: DC

## 2011-05-03 NOTE — Telephone Encounter (Signed)
Spoke with pt and gave her results of lab work from Sept. 20,2012 and Dr. Gibson Ramp instructions regarding the starting of Potassium.

## 2011-05-08 ENCOUNTER — Ambulatory Visit (HOSPITAL_COMMUNITY): Admission: RE | Admit: 2011-05-08 | Payer: Self-pay | Source: Ambulatory Visit | Admitting: Cardiovascular Disease

## 2011-05-08 ENCOUNTER — Inpatient Hospital Stay (HOSPITAL_BASED_OUTPATIENT_CLINIC_OR_DEPARTMENT_OTHER)
Admission: RE | Admit: 2011-05-08 | Discharge: 2011-05-08 | Disposition: A | Discharge status: Home / Self Care | Payer: Self-pay | Source: Ambulatory Visit | Attending: Cardiovascular Disease | Admitting: Cardiovascular Disease

## 2011-05-08 DIAGNOSIS — R0989 Other specified symptoms and signs involving the circulatory and respiratory systems: Secondary | ICD-10-CM | POA: Insufficient documentation

## 2011-05-08 DIAGNOSIS — I10 Essential (primary) hypertension: Secondary | ICD-10-CM | POA: Insufficient documentation

## 2011-05-08 DIAGNOSIS — R0609 Other forms of dyspnea: Secondary | ICD-10-CM | POA: Insufficient documentation

## 2011-05-08 DIAGNOSIS — R0789 Other chest pain: Secondary | ICD-10-CM | POA: Insufficient documentation

## 2011-05-08 DIAGNOSIS — R079 Chest pain, unspecified: Secondary | ICD-10-CM

## 2011-05-08 LAB — POCT I-STAT GLUCOSE
Glucose, Bld: 249 mg/dL — ABNORMAL HIGH (ref 70–99)
Operator id: 221371

## 2011-05-09 NOTE — Cardiovascular Report (Signed)
Brandy Scott, Brandy Scott NO.:  [id number]  MEDICAL RECORD NO.:  [id number]  LOCATION:  CATH                         FACILITY:  [facility]  PHYSICIAN:  Verne Carrow, MDDATE OF BIRTH:  1951/06/10  DATE OF PROCEDURE:  05/08/2011 DATE OF DISCHARGE:                           CARDIAC CATHETERIZATION   REFERRING PROVIDER:  Kalman Shan, MD  PRIMARY CARE PHYSICIAN:  Adabella Stanis. White, MD  PROCEDURES PERFORMED: 1. Left heart catheterization. 2. Selective coronary angiography. 3. Left ventricular angiogram.  OPERATOR:  Verne Carrow, MD  INDICATIONS:  This is a 60 year old Caucasian female with a history of hypertension and hyperlipidemia who has had recent complaints of substernal chest pressure with dyspnea.  The patient has no prior cardiac history.  I saw her in the office last week for evaluation.  The patient had also been seen recently by Dr. Kalman Shan, [facility] for workup for dyspnea.  Based on her risk factors and her clinical presentation, I felt that an ischemic workup is necessary.  The patient has no insurance and is a self-pay.  We discussed stress testing versus proceeding directly to cardiac catheterization.  After weighing the risks, benefits, and cost, we elected to proceed directly to a cardiac catheterization to exclude coronary artery disease and the source of her chest pain.  PROCEDURE IN DETAIL:  The patient was brought to the outpatient cardiac catheterization laboratory after signing informed consent for the procedure.  The right groin was prepped and draped in sterile fashion. Lidocaine 1% was used for local anesthesia.  A 4-French sheath was inserted into the right femoral artery without difficulty.  Standard diagnostic catheters were used to perform selective coronary angiography.  A pigtail catheter was used to perform a left ventricular angiogram.  The patient tolerated the procedure well.  There were  no immediate complications.  The patient was taken to the recovery room in stable condition.  HEMODYNAMIC FINDINGS:  Central aortic pressure 128/69.  Left ventricular pressure 130/13/14.  ANGIOGRAPHIC FINDINGS: 1. Left main coronary artery has no evidence of disease.  This vessel     bifurcated into the circumflex in the left anterior descending     artery. 2. Left anterior descending artery was a moderate-sized vessel that     coursed to the apex.  There is a large-caliber diagonal branch.     Neither of these vessels have any evidence of stenosis. 3. Circumflex artery was a moderate-sized vessel that gave off a     moderate-sized obtuse marginal branch.  There was no evidence of     disease in this vessel. 4. The right coronary artery was a large dominant vessel with no     evidence of disease. 5. Left ventricular angiogram was performed in the RAO projection and     it showed normal left ventricular systolic function with ejection     fraction of 65-70%.  IMPRESSION: 1. No angiographic evidence of coronary artery disease. 2. Normal left ventricular systolic function. 3. Normal left ventricular filling pressures. 4. Noncardiac chest pain.  RECOMMENDATIONS:  No further ischemic workup at this time.  I will have the patient wear a 48-hour Holter Monitor and plan on seeing her back in  the office in several weeks for a followup.     Verne Carrow, MD     CM/MEDQ  D:  05/08/2011  T:  05/08/2011  Job:  [id number]  Electronically Signed by Verne Carrow MD on 05/08/2011 01:24:00 PM

## 2011-05-14 ENCOUNTER — Ambulatory Visit: Payer: Self-pay | Admitting: Cardiovascular Disease

## 2011-05-17 ENCOUNTER — Telehealth: Payer: Self-pay

## 2011-05-17 ENCOUNTER — Telehealth: Payer: Self-pay | Admitting: Cardiovascular Disease

## 2011-05-17 NOTE — Telephone Encounter (Signed)
Spoke with pt who reports fleeting episodes of pain near right temple. Pain is not constant. Also had dizziness one time the other night. None since.  She is asking if this could be related to recent cath.  I told her it should not be related to cath and she should follow up with primary MD if she has concerns about the pain near her temple.  She is also asking about when she will get Holter Monitor. I discussed with Windell Moulding and she will contact pt to arrange time.

## 2011-05-17 NOTE — Telephone Encounter (Signed)
Patient c/o pain in right temple above the eye. Dizziness. S/p heart cath on 9/25.

## 2011-05-18 NOTE — Telephone Encounter (Signed)
Patient call back  °

## 2011-05-21 ENCOUNTER — Encounter (INDEPENDENT_AMBULATORY_CARE_PROVIDER_SITE_OTHER): Payer: Self-pay

## 2011-05-21 DIAGNOSIS — R002 Palpitations: Secondary | ICD-10-CM

## 2011-05-22 DIAGNOSIS — R0602 Shortness of breath: Secondary | ICD-10-CM

## 2011-05-23 ENCOUNTER — Encounter: Payer: Self-pay | Admitting: Cardiovascular Disease

## 2011-05-23 ENCOUNTER — Ambulatory Visit (INDEPENDENT_AMBULATORY_CARE_PROVIDER_SITE_OTHER): Payer: Self-pay | Admitting: Cardiovascular Disease

## 2011-05-23 ENCOUNTER — Telehealth: Payer: Self-pay | Admitting: Cardiovascular Disease

## 2011-05-23 VITALS — BP 141/77 | HR 93 | Resp 12 | Wt 250.0 lb

## 2011-05-23 DIAGNOSIS — R079 Chest pain, unspecified: Secondary | ICD-10-CM

## 2011-05-23 DIAGNOSIS — I498 Other specified cardiac arrhythmias: Secondary | ICD-10-CM

## 2011-05-23 DIAGNOSIS — I471 Supraventricular tachycardia: Secondary | ICD-10-CM

## 2011-05-23 NOTE — Assessment & Plan Note (Signed)
She has occasional episodes of tachycardia and palpitations but usually not prolonged. No dizziness. Holter with short runs of SVT. She does not wish to start a beta blocker. She will avoid stimulants such as caffeine, energy drinks and over the counter cold meds. She will call if symptoms worsen or change.

## 2011-05-23 NOTE — Assessment & Plan Note (Signed)
Non-cardiac. No evidence of CAD on cath September 2012. No further cardiac workup.

## 2011-05-23 NOTE — Patient Instructions (Signed)
Your physician wants you to follow-up in:  6 months. You will receive a reminder letter in the mail two months in advance. If you don't receive a letter, please call our office to schedule the follow-up appointment.   

## 2011-05-23 NOTE — Telephone Encounter (Signed)
Reviewed with Dr. Clifton James and pt to let us know if she has increase in palpitations and med will be prescribed at that time.  I called and spoke with pt and gave her this information. She feels well now and will call us if palpitations increase.

## 2011-05-23 NOTE — Progress Notes (Signed)
History of Present Illness:60 yo WF with h/o HTN, HLD, DM, Gout, asthma here today for cardiac follow up. I saw her as a new patient three weeks ago.  She recently saw Dr. Marchelle Gearing in the pulmonary clinic and c/o substernal chest pressure with dyspnea. She has no prior cardiac history. She has no family history of CAD. She tells me that she has had dyspnea with minimal exertion for one year. When the dyspnea resolves, she feels a pressure across both breasts. She also notes a fluttering in her chest. Lasts for a few minutes. She also notes periods of tachycardia. Recent cardiopulmonary stress test without chest pain. No dizziness, near syncope or syncope. Based on her symptoms, I arranged a left heart cath on 05/08/11. There was no evidence of CAD. Her access site for the cath is ok. Her 48 hour Holter monitor showed sinus rhythm with occasional PACs, several short runs of SVT.   She is here today for f/u. She has been feeling well. Her chest pain has resolved. She still has dyspnea with exertion. She feels occasional palpitations. No dizziness, near syncope or syncope.    Past Medical History  Diagnosis Date  . Hypercholesteremia   . HTN (hypertension)   . Diabetes mellitus   . Gout   . COPD (chronic obstructive pulmonary disease)   . SVT (supraventricular tachycardia)     Past Surgical History  Procedure Date  . Hernia repair   . Hysteroscopy   . Dilation and curettage of uterus   . Tubal ligation     Current Outpatient Prescriptions  Medication Sig Dispense Refill  . aspirin 81 MG tablet Take 81 mg by mouth daily.        . fish oil-omega-3 fatty acids 1000 MG capsule Take 2 g by mouth daily.        . hydrochlorothiazide 25 MG tablet Take 25 mg by mouth daily.        Marland Kitchen losartan (COZAAR) 100 MG tablet Take 50 mg by mouth daily.        . metFORMIN (GLUCOPHAGE) 1000 MG tablet Take 1,000 mg by mouth daily with breakfast.        . potassium chloride SA (K-DUR,KLOR-CON) 20 MEQ tablet Take 2  tablets by mouth daily  60 tablet  6    No Known Allergies  History   Social History  . Marital Status: Married    Spouse Name: N/A    Number of Children: 2  . Years of Education: N/A   Occupational History  . unemployed    Social History Main Topics  . Smoking status: Former Smoker -- 1.0 packs/day for 42 years    Types: Cigarettes    Quit date: 05/14/2010  . Smokeless tobacco: Never Used  . Alcohol Use: No  . Drug Use: No  . Sexually Active: Not on file   Other Topics Concern  . Not on file   Social History Narrative  . No narrative on file    Family History  Problem Relation Age of Onset  . Hypertension Father   . Diabetes Father   . Parkinsonism Father   . Colon cancer Mother   . Alzheimer's disease Mother   . Clotting disorder Mother     had filter placed    Review of Systems:  As stated in the HPI and otherwise negative.   BP 141/77  Pulse 93  Resp 12  Wt 250 lb (113.399 kg)  Physical Examination: General: Well developed, well nourished, NAD  HEENT: OP clear, mucus membranes moist SKIN: warm, dry. No rashes. Neuro: No focal deficits Musculoskeletal: Muscle strength 5/5 all ext Psychiatric: Mood and affect normal Neck: No JVD, no carotid bruits, no thyromegaly, no lymphadenopathy. Lungs:Clear bilaterally, no wheezes, rhonci, crackles Cardiovascular: Regular rate and rhythm. No murmurs, gallops or rubs. Abdomen:Soft. Bowel sounds present. Non-tender.  Extremities: No lower extremity edema. Pulses are 2 + in the bilateral DP/PT.  48 Hour Holter Monitor: NSR, several short runs of SVT  Cardiac Cath 05/08/11:  HEMODYNAMIC FINDINGS: Central aortic pressure 128/69. Left ventricular  pressure 130/13/14.  ANGIOGRAPHIC FINDINGS:  1. Left main coronary artery has no evidence of disease. This vessel  bifurcated into the circumflex in the left anterior descending  artery.  2. Left anterior descending artery was a moderate-sized vessel that  coursed to  the apex. There is a large-caliber diagonal branch.  Neither of these vessels have any evidence of stenosis.  3. Circumflex artery was a moderate-sized vessel that gave off a  moderate-sized obtuse marginal branch. There was no evidence of  disease in this vessel.  4. The right coronary artery was a large dominant vessel with no  evidence of disease.  5. Left ventricular angiogram was performed in the RAO projection and  it showed normal left ventricular systolic function with ejection  fraction of 65-70%.

## 2011-05-23 NOTE — Telephone Encounter (Signed)
Pt was in today and dr Clifton James was to give her an rx, she didn't get it and would like a written one, she doesn't have insurance and needs to check prices before filling it, pls call when ready

## 2011-05-28 ENCOUNTER — Ambulatory Visit (INDEPENDENT_AMBULATORY_CARE_PROVIDER_SITE_OTHER): Payer: Self-pay | Admitting: Internal Medicine

## 2011-05-28 ENCOUNTER — Encounter: Payer: Self-pay | Admitting: Internal Medicine

## 2011-05-28 VITALS — BP 110/68 | HR 74 | Temp 98.1°F | Ht 67.0 in | Wt 248.2 lb

## 2011-05-28 DIAGNOSIS — R0600 Dyspnea, unspecified: Secondary | ICD-10-CM

## 2011-05-28 DIAGNOSIS — R0989 Other specified symptoms and signs involving the circulatory and respiratory systems: Secondary | ICD-10-CM

## 2011-05-28 NOTE — Progress Notes (Signed)
Subjective:    Patient ID: Brandy Scott, female    DOB: 04-28-1951, 60 y.o.   MRN: [id number]  HPI IOV 03/15/11: 60 year old obese female, ex 42 pack smoker (quit oct 2011). Since quitting smoking (no change in weight), reports insidious onset of dyspnea. Progressive since onset. Dyspnea brought on by exertional activities like walking to mail box and back on incline, finishing shower. No longer able to help husband in  [place]. Always relieved by rest but now has dyspnea occasional at rest. Dyspnea rated as moderate.   There is associated cough. Insidious onset. STarted few years ago as dry cough even before she quit smoking. After quitting smoking, cough is not worse but now associaed with sputum which is normall white but occasionally with greenish tint. Admits to occ GERD for which she takes tums prn (she is on fish oil but denies this is provoking gerd). Denies sinus drainage. No ACE inhibitor use (is on ARB  - losartan).   There is also associated wheeze esp at night and chest heaviness but no hemoptysis, syncope, chest pains, edema, orthopnea, paroxysmal nocturnal dyspnea. Unsure if she snores at night but does admit to easy excess day time somnolence. Denies family hx of COPD/asthma  She had spirometry 12/07/2010 - suggests moderate restriction: Fev1 1.77L/61%, Ratio 74 (78), small airways 1.56/60%. However, there is 9%/210cc improvement on FVC with bronchodilator. Flow volume loops esp exp phase are blunted peak flow and bit erratci  Trials of spiriva and dulera each 1 month have not helped. CXR per hx of patient last month was normal. Walking desat test in office 185 feet x 3 laps - no desaturation.    Ov 04/30/11: [place] dyspnea after CPST. Here with husband. Reports persistence of above dyspnea, cough, wheeze. Denies chest pain last visit and today. However, husband reminded her that she on occasion has had some chest pain lower sternum with radiation under both breasts. This happens on  times when dyspnea is extreme or exertion in extreme. Relieved by rest. Rates chest pain as mild-moderate. Overall dyspnea is unchanged to a maybe a bit worse since before. Getting dyspneic with vaccumming (stops after 1 room), bending over or cleaning house; examples of she is worse since last visit. She recollects that spiriva and dulera (tried them separately each for 1 month) did not help. However, she now states that she was using dulera only 2 puff once daily. No new problems.   CPST in august 2012 reviewed: dyspnea due to obesity but also strongly positive for asthma. In addition, at rest she had diffuse T wave inversion and had hypertensive response. I think she obesity, asthma, hypertensive response and ? Occult CAD are contributing to dyspnea  REC: Your shortness of breath on test is due to high bp, possibly T wave inversion playing a role, asthma and possibly from underlying weight issues  Please see cardiology - Dr Clifton James or Dr Excell Seltzer or Dr Shirlee Latch asap  Please start symbicort samples 2 puff tiwce daily - take 2 samples  Please take albuterol 2 puff as needed  Return in 4 weeks to discuss further  OV 05/28/11: Followup dyspnea. Had Rt heart cath 05/08/11: Per Dr Clifton James notes: it showed no CAD. Holter showed some SVT runs. She is wondering if this is cause of dyspnea. She is very frustrated that she still is dyspneic. Refusing rehab due to cost. States anti-asthma Rx does not work. She iss upset that we are not able to give a definitive answer for  dyspnea. She is in tears  Past, Family, Social reviewed: as in hpi. And same as prior ov.     Review of Systems  Constitutional: Negative for fever and unexpected weight change.  HENT: Negative for ear pain, nosebleeds, congestion, sore throat, rhinorrhea, sneezing, trouble swallowing, dental problem, postnasal drip and sinus pressure.   Eyes: Negative for redness and itching.  Respiratory: Positive for shortness of breath. Negative for  cough, chest tightness and wheezing.   Cardiovascular: Negative for palpitations and leg swelling.  Gastrointestinal: Negative for nausea and vomiting.  Genitourinary: Negative for dysuria.  Musculoskeletal: Negative for joint swelling.  Skin: Negative for rash.  Neurological: Negative for headaches.  Hematological: Does not bruise/bleed easily.  Psychiatric/Behavioral: Negative for dysphoric mood. The patient is not nervous/anxious.        Objective:   Physical Exam Constitutional: She is oriented to person, place, and time. She appears well-developed and well-nourished. No distress.       obese  HENT:  Head: Normocephalic and atraumatic.  Right Ear: External ear normal.  Left Ear: External ear normal.  Mouth/Throat: Oropharynx is clear and moist. No oropharyngeal exudate.  Eyes: Conjunctivae and EOM are normal. Pupils are equal, round, and reactive to light. Right eye exhibits no discharge. Left eye exhibits no discharge. No scleral icterus.  Neck: Normal range of motion. Neck supple. No JVD present. No tracheal deviation present. No thyromegaly present.  Cardiovascular: Normal rate, regular rhythm, normal heart sounds and intact distal pulses.  Exam reveals no gallop and no friction rub.   No murmur heard. Pulmonary/Chest: Effort normal and breath sounds normal. No respiratory distress. She has no wheezes. She has no rales. She exhibits no tenderness.  Abdominal: Soft. Bowel sounds are normal. She exhibits no distension and no mass. There is no tenderness. There is no rebound and no guarding.  Musculoskeletal: Normal range of motion. She exhibits no edema and no tenderness.  Lymphadenopathy:    She has no cervical adenopathy.  Neurological: She is alert and oriented to person, place, and time. She has normal reflexes. No cranial nerve deficit. She exhibits normal muscle tone. Coordination normal.  Skin: Skin is warm and dry. No rash noted. She is not diaphoretic. No erythema. No  pallor.  Psychiatric: She has a normal mood and affect. Her behavior is normal. Judgment and thought content normal.        Assessment & Plan:

## 2011-05-28 NOTE — Patient Instructions (Signed)
Stop dulera - take QVAR samples instead (I am changing because one of the medicines in dulera can contribute to fast heart rate that was seen at holter) I will touch base with Dr. Clifton James about the fast heart rate during holter and see what this means Please buy a heart rate monitor with chest belt - use that to exercise. Keep your heart rate below 130/min. If goes higher, rest and try exercising again Follow [facility] lipid sheet diet and try to lose weight Return in 6 - 9  months to report progress

## 2011-06-11 ENCOUNTER — Telehealth: Payer: Self-pay | Admitting: Internal Medicine

## 2011-06-11 NOTE — Telephone Encounter (Signed)
Hi Brandy Scott   She had some SVT runs and she wondered if this was cause of her dyspnea. Your thoughts ?  Thanks [employer]

## 2011-06-11 NOTE — Assessment & Plan Note (Addendum)
Asthma, decondtioning, obesity are likely etiologies but she wont attent rehab due to cost. CAD ruled out. She is concerned if occ SVT runs are contributory to dyspnea. She is willing to try exercise regimen at home; I have given her a print out of heart rate monitor she could buy and use it as a guide to help her. Will give QVAR for asthma (avoid LABA due to the SVT runs). Gave some tips on weight loss; [facility] lipid sheet given  > 20 min face to face counseling. > 50% of time is face to face counseling

## 2011-06-12 NOTE — Telephone Encounter (Signed)
Brandy Scott, It is possible that the SVT could be causing some dyspnea but it seemed that most of her dyspnea and chest pressure was exertional. Coronaries were ok. I offered her a beta blocker or calcium channel blocker but she did not want to start anything at this time. It would be reasonable to try either class of medicines if her symptoms continue.   Thayer Ohm

## 2011-06-13 NOTE — Telephone Encounter (Signed)
Ok thanks. I wil keep that in mind. I think $ is a huge issue for her. Thanks, MR

## 2011-06-26 ENCOUNTER — Telehealth: Payer: Self-pay | Admitting: Cardiovascular Disease

## 2011-06-26 DIAGNOSIS — R002 Palpitations: Secondary | ICD-10-CM

## 2011-06-26 MED ORDER — DILTIAZEM HCL ER COATED BEADS 120 MG PO CP24: 120.0000 mg | ORAL_CAPSULE | Freq: Every day | ORAL | Status: DC

## 2011-06-26 NOTE — Telephone Encounter (Signed)
Spoke with pt and gave her instructions from Dr. Clifton James to start Cardizem. Will send to [facility] per pt's request. She will call us if palpitations continue after starting Cardizem.

## 2011-06-26 NOTE — Telephone Encounter (Signed)
New problem:  Patient calling C/O heart fluttering. S/p cath. Patient would like to start on new medication

## 2011-06-26 NOTE — Telephone Encounter (Signed)
Can we start her on Cardizem CD 120 mg po Qdaily? Thanks, chris

## 2011-06-26 NOTE — Telephone Encounter (Signed)
Spoke with pt. She reports increase in palpitations since office visit with Dr. Clifton James.  She states sometimes she will go several times without any fluttery feeling and other times it will happen several times per day. Lasts sometimes for just a few seconds and other times will last a few minutes.   Will review with Dr. Clifton James.

## 2011-12-10 ENCOUNTER — Encounter: Payer: Self-pay | Admitting: Internal Medicine

## 2012-01-17 ENCOUNTER — Encounter: Payer: Self-pay | Admitting: Internal Medicine

## 2012-01-17 ENCOUNTER — Ambulatory Visit (INDEPENDENT_AMBULATORY_CARE_PROVIDER_SITE_OTHER): Payer: Self-pay | Admitting: Internal Medicine

## 2012-01-17 VITALS — BP 124/70 | HR 94 | Temp 98.3°F | Ht 66.0 in | Wt 244.6 lb

## 2012-01-17 DIAGNOSIS — R0600 Dyspnea, unspecified: Secondary | ICD-10-CM

## 2012-01-17 DIAGNOSIS — R0989 Other specified symptoms and signs involving the circulatory and respiratory systems: Secondary | ICD-10-CM

## 2012-01-17 DIAGNOSIS — R0609 Other forms of dyspnea: Secondary | ICD-10-CM

## 2012-01-17 NOTE — Assessment & Plan Note (Signed)
Dyspnea is unchagned despite dulera, and cardizem. AGain emphasized that weight is main cause of dyspnea. We disussed her diet and was immediately obvious she is eating too much bread  And other high glycemic foods. Showed her list of low glycemic foods and how to eat healthy. Explained that cheapest and most effective way to relieve dyspnea is through weight loss. Offered 2nd opinion but at this point she will try to lose weight  > 50% of this 15 min visit spent in face to face counseling

## 2012-01-17 NOTE — Progress Notes (Signed)
Subjective:    Patient ID: Brandy Scott, female    DOB: 02-Apr-1951, 61 y.o.   MRN: [id number]  HPI IOV 03/15/11: 60 year old obese female, ex 42 pack smoker (quit oct 2011). Since quitting smoking (no change in weight), reports insidious onset of dyspnea. Progressive since onset. Dyspnea brought on by exertional activities like walking to mail box and back on incline, finishing shower. No longer able to help husband in  [place]. Always relieved by rest but now has dyspnea occasional at rest. Dyspnea rated as moderate.   There is associated cough. Insidious onset. STarted few years ago as dry cough even before she quit smoking. After quitting smoking, cough is not worse but now associaed with sputum which is normall white but occasionally with greenish tint. Admits to occ GERD for which she takes tums prn (she is on fish oil but denies this is provoking gerd). Denies sinus drainage. No ACE inhibitor use (is on ARB  - losartan).   There is also associated wheeze esp at night and chest heaviness but no hemoptysis, syncope, chest pains, edema, orthopnea, paroxysmal nocturnal dyspnea. Unsure if she snores at night but does admit to easy excess day time somnolence. Denies family hx of COPD/asthma  She had spirometry 12/07/2010 - suggests moderate restriction: Fev1 1.77L/61%, Ratio 74 (78), small airways 1.56/60%. However, there is 9%/210cc improvement on FVC with bronchodilator. Flow volume loops esp exp phase are blunted peak flow and bit erratci  Trials of spiriva and dulera each 1 month have not helped. CXR per hx of patient last month was normal. Walking desat test in office 185 feet x 3 laps - no desaturation.    Ov 04/30/11: [place] dyspnea after CPST. Here with husband. Reports persistence of above dyspnea, cough, wheeze. Denies chest pain last visit and today. However, husband reminded her that she on occasion has had some chest pain lower sternum with radiation under both breasts. This happens on  times when dyspnea is extreme or exertion in extreme. Relieved by rest. Rates chest pain as mild-moderate. Overall dyspnea is unchanged to a maybe a bit worse since before. Getting dyspneic with vaccumming (stops after 1 room), bending over or cleaning house; examples of she is worse since last visit. She recollects that spiriva and dulera (tried them separately each for 1 month) did not help. However, she now states that she was using dulera only 2 puff once daily. No new problems.   CPST in august 2012 reviewed: dyspnea due to obesity but also strongly positive for asthma. In addition, at rest she had diffuse T wave inversion and had hypertensive response. I think she obesity, asthma, hypertensive response and ? Occult CAD are contributing to dyspnea  REC: Your shortness of breath on test is due to high bp, possibly T wave inversion playing a role, asthma and possibly from underlying weight issues  Please see cardiology - Dr Clifton James or Dr Excell Seltzer or Dr Shirlee Latch asap  Please start symbicort samples 2 puff tiwce daily - take 2 samples  Please take albuterol 2 puff as needed  Return in 4 weeks to discuss further  OV 05/28/11: Followup dyspnea. Had Rt heart cath 05/08/11: Per Dr Clifton James notes: it showed no CAD. Holter showed some SVT runs; Dr Clifton James did not think this was cause of dyspnea and she declined beta or calcium channel blocker.. She is wondering if this is cause of dyspnea. She is very frustrated that she still is dyspneic. Refusing rehab due to cost. States anti-asthma  Rx does not work. She iss upset that we are not able to give a definitive answer for dyspnea. She is in tears  Past, Family, Social reviewed: as in hpi. And same as prior ov.   REC Stop dulera - take QVAR samples instead (I am changing because one of the medicines in dulera can contribute to fast heart rate that was seen at holter)  I will touch base with Dr. Clifton James about the fast heart rate during holter and see what this  means  Please buy a heart rate monitor with chest belt - use that to exercise. Keep your heart rate below 130/min. If goes higher, rest and try exercising again  Follow [facility] lipid sheet diet and try to lose weight  Return in 6 - 9 months to report progress    OV 01/17/2012 Followup dyspnea due to obesity, EIB, hypertensive response to exercise on CPST and possibly occ SVT runs   Now taking cardizem for the bp and svt runs. Takes dulera samples for EIB. But still dyspneic for class 3 exetional levels like vacuuming. Somewhat worrseFeels 6-9  months ago was able to vacuum half the worrkload in the house but now only 25% of the workload and will have to stop for rest. Dyspnea improved by rest. Rates dyspnea as moderate. No associated chest pain, cough, wheeze.   Of note, there is no weight loss; Body mass index is 39.48 kg/(m^2). She has no idea what the low glycemic plan we gave her last visit was about. I went over her diet and she eats way too much bread. Most of her diet is full of high glycemic carbohydrates   MEDS Current outpatient prescriptions:aspirin 81 MG tablet, Take 81 mg by mouth daily.  , Disp: , Rfl: ;  diltiazem (CARDIZEM CD) 120 MG 24 hr capsule, Take 1 capsule (120 mg total) by mouth daily., Disp: 30 capsule, Rfl: 11;  fish oil-omega-3 fatty acids 1000 MG capsule, Take 2 g by mouth daily.  , Disp: , Rfl: ;  hydrochlorothiazide 25 MG tablet, Take 25 mg by mouth daily.  , Disp: , Rfl:  losartan (COZAAR) 100 MG tablet, Take 50 mg by mouth daily.  , Disp: , Rfl: ;  metFORMIN (GLUCOPHAGE) 1000 MG tablet, Take 1,000 mg by mouth daily with breakfast.  , Disp: , Rfl: ;  mometasone-formoterol (DULERA) 100-5 MCG/ACT AERO, Inhale 2 puffs into the lungs 2 (two) times daily.  , Disp: , Rfl:    Review of Systems  Constitutional: Negative for fever and unexpected weight change.  HENT: Negative for ear pain, nosebleeds, congestion, sore throat, rhinorrhea, sneezing, trouble swallowing, dental  problem, postnasal drip and sinus pressure.   Eyes: Negative for redness and itching.  Respiratory: Positive for cough, shortness of breath and wheezing. Negative for chest tightness.   Cardiovascular: Positive for leg swelling. Negative for palpitations.  Gastrointestinal: Negative for nausea and vomiting.  Genitourinary: Negative for dysuria.  Musculoskeletal: Negative for joint swelling.  Skin: Negative for rash.  Neurological: Negative for headaches.  Hematological: Bruises/bleeds easily.  Psychiatric/Behavioral: Negative for dysphoric mood. The patient is not nervous/anxious.        Objective:   Physical Exam  Discussion only visit  Constitutional: She is oriented to person, place, and time. She appears well-developed and well-nourished. No distress.       obese  Body mass index is 39.48 kg/(m^2). Marland Kitchen  Cardiovascular: Normal rate, regular rhythm, normal heart sounds and intact distal pulses.  Exam reveals no gallop  and no friction rub.   No murmur heard. Pulmonary/Chest: Effort normal and breath sounds normal. No respiratory distress. She has no wheezes. She has no rales. She exhibits no tenderness.  Neurological: She is alert and oriented to person, place, and time. She has normal reflexes. No cranial nerve deficit. She exhibits normal muscle tone. Coordination normal.  Skin: Skin is warm and dry. No rash noted. She is not diaphoretic. No erythema. No pallor.  Psychiatric: She has a normal mood and affect. Her behavior is normal. Judgment and thought content normal.        Assessment & Plan:

## 2012-01-17 NOTE — Patient Instructions (Signed)
Continue dulera; take some samples. If expensive, call us Please buy a heart rate monitor with chest belt - use that to exercise. Keep your heart rate below 130/min. If goes higher, rest and try exercising again Follow [facility] lipid sheet diet and try to lose weight Return in  9  months to report progress

## 2012-08-11 ENCOUNTER — Telehealth: Payer: Self-pay | Admitting: Internal Medicine

## 2012-08-11 NOTE — Telephone Encounter (Signed)
Called, spoke with pt.  She would like to switch from [facility] to someone in the [place] office.  Pt states she lives in [place], and the [place] office would be much more convenient for her.  Per TD, if pt is changing providers d/t location, this does not need to be approved by both doctors.  Pt has no preference in either Dr. Vassie Loll or Dr .Delford Field.  She is aware of the days both drs are in [place].  She is requesting to call back closer to March when she is due for her f/u to schedule this appt.  Nothing further needed at this time.

## 2012-10-09 ENCOUNTER — Ambulatory Visit (INDEPENDENT_AMBULATORY_CARE_PROVIDER_SITE_OTHER): Payer: BC Managed Care – PPO | Admitting: Critical Care Medicine

## 2012-10-09 ENCOUNTER — Encounter: Payer: Self-pay | Admitting: Critical Care Medicine

## 2012-10-09 VITALS — BP 142/80 | HR 92 | Temp 98.5°F | Ht 67.0 in | Wt 251.0 lb

## 2012-10-09 DIAGNOSIS — J45990 Exercise induced bronchospasm: Secondary | ICD-10-CM

## 2012-10-09 MED ORDER — ALBUTEROL SULFATE HFA 108 (90 BASE) MCG/ACT IN AERS: 2.0000 | INHALATION_SPRAY | Freq: Four times a day (QID) | RESPIRATORY_TRACT | Status: DC | PRN

## 2012-10-09 MED ORDER — MONTELUKAST SODIUM 10 MG PO TABS: 10.0000 mg | ORAL_TABLET | Freq: Every day | ORAL | Status: DC

## 2012-10-09 NOTE — Patient Instructions (Addendum)
Ventolin 1-2 puff prior to any exercise or heavy exertion Singulair daily Stop dulera Return 2 months

## 2012-10-09 NOTE — Progress Notes (Signed)
Subjective:    Patient ID: Brandy Scott, female    DOB: 1951-05-24, 62 y.o.   MRN: [id number]  Shortness of Breath Associated symptoms include leg swelling and wheezing. Pertinent negatives include no ear pain, fever, headaches, rash, rhinorrhea, sore throat or vomiting.   IOV 03/15/11: 62 year old obese female, ex 42 pack smoker (quit oct 2011). Since quitting smoking (no change in weight), reports insidious onset of dyspnea. Progressive since onset. Dyspnea brought on by exertional activities like walking to mail box and back on incline, finishing shower. No longer able to help husband in  [place]. Always relieved by rest but now has dyspnea occasional at rest. Dyspnea rated as moderate.   There is associated cough. Insidious onset. STarted few years ago as dry cough even before she quit smoking. After quitting smoking, cough is not worse but now associaed with sputum which is normall white but occasionally with greenish tint. Admits to occ GERD for which she takes tums prn (she is on fish oil but denies this is provoking gerd). Denies sinus drainage. No ACE inhibitor use (is on ARB  - losartan).   There is also associated wheeze esp at night and chest heaviness but no hemoptysis, syncope, chest pains, edema, orthopnea, paroxysmal nocturnal dyspnea. Unsure if she snores at night but does admit to easy excess day time somnolence. Denies family hx of COPD/asthma  She had spirometry 12/07/2010 - suggests moderate restriction: Fev1 1.77L/61%, Ratio 74 (78), small airways 1.56/60%. However, there is 9%/210cc improvement on FVC with bronchodilator. Flow volume loops esp exp phase are blunted peak flow and bit erratci  Trials of spiriva and dulera each 1 month have not helped. CXR per hx of patient last month was normal. Walking desat test in office 185 feet x 3 laps - no desaturation.    Ov 04/30/11: [place] dyspnea after CPST. Here with husband. Reports persistence of above dyspnea, cough, wheeze.  Denies chest pain last visit and today. However, husband reminded her that she on occasion has had some chest pain lower sternum with radiation under both breasts. This happens on times when dyspnea is extreme or exertion in extreme. Relieved by rest. Rates chest pain as mild-moderate. Overall dyspnea is unchanged to a maybe a bit worse since before. Getting dyspneic with vaccumming (stops after 1 room), bending over or cleaning house; examples of she is worse since last visit. She recollects that spiriva and dulera (tried them separately each for 1 month) did not help. However, she now states that she was using dulera only 2 puff once daily. No new problems.   CPST in august 2012 reviewed: dyspnea due to obesity but also strongly positive for asthma. In addition, at rest she had diffuse T wave inversion and had hypertensive response. I think she obesity, asthma, hypertensive response and ? Occult CAD are contributing to dyspnea  REC: Your shortness of breath on test is due to high bp, possibly T wave inversion playing a role, asthma and possibly from underlying weight issues  Please see cardiology - Dr Clifton James or Dr Excell Seltzer or Dr Shirlee Latch asap  Please start symbicort samples 2 puff tiwce daily - take 2 samples  Please take albuterol 2 puff as needed  Return in 4 weeks to discuss further  OV 05/28/11: Followup dyspnea. Had Rt heart cath 05/08/11: Per Dr Clifton James notes: it showed no CAD. Holter showed some SVT runs; Dr Clifton James did not think this was cause of dyspnea and she declined beta or calcium channel blocker.Marland Kitchen  She is wondering if this is cause of dyspnea. She is very frustrated that she still is dyspneic. Refusing rehab due to cost. States anti-asthma Rx does not work. She iss upset that we are not able to give a definitive answer for dyspnea. She is in tears  Past, Family, Social reviewed: as in hpi. And same as prior ov.   REC Stop dulera - take QVAR samples instead (I am changing because one of  the medicines in dulera can contribute to fast heart rate that was seen at holter)  I will touch base with Dr. Clifton James about the fast heart rate during holter and see what this means  Please buy a heart rate monitor with chest belt - use that to exercise. Keep your heart rate below 130/min. If goes higher, rest and try exercising again  Follow [facility] lipid sheet diet and try to lose weight  Return in 6 - 9 months to report progress    OV 01/17/12 Followup dyspnea due to obesity, EIB, hypertensive response to exercise on CPST and possibly occ SVT runs   Now taking cardizem for the bp and svt runs. Takes dulera samples for EIB. But still dyspneic for class 3 exetional levels like vacuuming. Somewhat worrseFeels 6-9  months ago was able to vacuum half the worrkload in the house but now only 25% of the workload and will have to stop for rest. Dyspnea improved by rest. Rates dyspnea as moderate. No associated chest pain, cough, wheeze.   Of note, there is no weight loss; Body mass index is 39.3 kg/(m^2). She has no idea what the low glycemic plan we gave her last visit was about. I went over her diet and she eats way too much bread. Most of her diet is full of high glycemic carbohydrates  10/09/2012 MR pt. Last seen 01/17/12. Rec then: Dyspnea is unchagned despite dulera, and cardizem. AGain emphasized that weight is main cause of dyspnea. We disussed her diet and was immediately obvious she is eating too much bread And other high glycemic foods. Showed her list of low glycemic foods and how to eat healthy. Explained that cheapest and most effective way to relieve dyspnea is through weight loss. Offered 2nd opinion but at this point she will try to lose weight   Pt switched to [facility] due to convenience.    Since last OV, used dulera on occasion and did not help. If exercises, any motion , mail box, vacuum will get dyspneic.  HR will elevate with activity. CPST : neg for pulm issues, no desaturation.   No albuterol given, just dulera.  Weight unchanged     MEDS Current outpatient prescriptions:aspirin 81 MG tablet, Take 81 mg by mouth daily.  , Disp: , Rfl: ;  diltiazem (CARDIZEM CD) 120 MG 24 hr capsule, Take 1 capsule (120 mg total) by mouth daily., Disp: 30 capsule, Rfl: 11;  fish oil-omega-3 fatty acids 1000 MG capsule, Take 2 g by mouth daily.  , Disp: , Rfl: ;  hydrochlorothiazide 25 MG tablet, Take 25 mg by mouth daily.  , Disp: , Rfl:  losartan (COZAAR) 100 MG tablet, Take 50 mg by mouth daily.  , Disp: , Rfl: ;  metFORMIN (GLUCOPHAGE) 1000 MG tablet, Take 1,000 mg by mouth daily with breakfast.  , Disp: , Rfl: ;  mometasone-formoterol (DULERA) 100-5 MCG/ACT AERO, Inhale 2 puffs into the lungs 2 (two) times daily.  , Disp: , Rfl:    Review of Systems  Constitutional: Negative  for fever and unexpected weight change.  HENT: Negative for ear pain, nosebleeds, congestion, sore throat, rhinorrhea, sneezing, trouble swallowing, dental problem, postnasal drip and sinus pressure.   Eyes: Negative for redness and itching.  Respiratory: Positive for cough, shortness of breath and wheezing. Negative for chest tightness.   Cardiovascular: Positive for leg swelling. Negative for palpitations.  Gastrointestinal: Negative for nausea and vomiting.  Genitourinary: Negative for dysuria.  Musculoskeletal: Negative for joint swelling.  Skin: Negative for rash.  Neurological: Negative for headaches.  Hematological: Bruises/bleeds easily.  Psychiatric/Behavioral: Negative for dysphoric mood. The patient is not nervous/anxious.        Objective:   Physical Exam  Filed Vitals:   10/09/12 1120  BP: 142/80  Pulse: 92  Temp: 98.5 F (36.9 C)  TempSrc: Oral  Height: 5\' 7"  (1.702 m)  Weight: 251 lb (113.853 kg)  SpO2: 96%    Gen: obese   in no distress,  normal affect  ENT: No lesions,  mouth clear,  oropharynx clear, no postnasal drip  Neck: No JVD, no TMG, no carotid bruits  Lungs: No  use of accessory muscles, no dullness to percussion, clear without rales or rhonchi  Cardiovascular: RRR, heart sounds normal, no murmur or gallops, no peripheral edema  Abdomen: soft and NT, no HSM,  BS normal  Musculoskeletal: No deformities, no cyanosis or clubbing  Neuro: alert, non focal  Skin: Warm, no lesions or rashes  CXR reviewed: no active disease       Assessment & Plan:   Exercise-induced asthma Probable exercise-induced asthma without resting airway obstruction or inflammation seen Plan Begin Singulair 10 mg daily Begin short acting albuterol to be used 1-2 puffs prior to exercise Return 2 months   Updated Medication List Outpatient Encounter Prescriptions as of 10/09/2012  Medication Sig Dispense Refill  . aspirin 81 MG tablet Take 81 mg by mouth daily.        . fish oil-omega-3 fatty acids 1000 MG capsule Take 2 g by mouth daily.        . hydrochlorothiazide 25 MG tablet Take 25 mg by mouth daily.        Marland Kitchen losartan (COZAAR) 100 MG tablet Take 50 mg by mouth daily.        . metFORMIN (GLUCOPHAGE) 1000 MG tablet Take 1,000 mg by mouth 2 (two) times daily with a meal.       . albuterol (PROVENTIL HFA;VENTOLIN HFA) 108 (90 BASE) MCG/ACT inhaler Inhale 2 puffs into the lungs every 6 (six) hours as needed for wheezing (prior to each exercise period).  1 Inhaler  6  . montelukast (SINGULAIR) 10 MG tablet Take 1 tablet (10 mg total) by mouth at bedtime.  30 tablet  6  . [DISCONTINUED] diltiazem (CARDIZEM CD) 120 MG 24 hr capsule Take 1 capsule (120 mg total) by mouth daily.  30 capsule  11  . [DISCONTINUED] mometasone-formoterol (DULERA) 100-5 MCG/ACT AERO Inhale 2 puffs into the lungs 2 (two) times daily.         No facility-administered encounter medications on file as of 10/09/2012.

## 2012-10-09 NOTE — Assessment & Plan Note (Signed)
Probable exercise-induced asthma without resting airway obstruction or inflammation seen Plan Begin Singulair 10 mg daily Begin short acting albuterol to be used 1-2 puffs prior to exercise Return 2 months

## 2012-11-12 ENCOUNTER — Encounter: Payer: Self-pay | Admitting: Internal Medicine

## 2012-12-08 ENCOUNTER — Encounter: Payer: Self-pay | Admitting: Critical Care Medicine

## 2012-12-08 ENCOUNTER — Ambulatory Visit (INDEPENDENT_AMBULATORY_CARE_PROVIDER_SITE_OTHER): Payer: BC Managed Care – PPO | Admitting: Critical Care Medicine

## 2012-12-08 VITALS — BP 140/76 | HR 85 | Temp 98.0°F | Ht 66.5 in | Wt 247.0 lb

## 2012-12-08 DIAGNOSIS — J449 Chronic obstructive pulmonary disease, unspecified: Secondary | ICD-10-CM

## 2012-12-08 MED ORDER — TIOTROPIUM BROMIDE MONOHYDRATE 18 MCG IN CAPS: 18.0000 ug | ORAL_CAPSULE | Freq: Every day | RESPIRATORY_TRACT | Status: DC

## 2012-12-08 NOTE — Patient Instructions (Addendum)
Stop albuterol Stop Singulair Start Spiriva one capsule in handihaler daily Return 2 months for recheck

## 2012-12-08 NOTE — Assessment & Plan Note (Signed)
Gold stage B. COPD that was formerly labeled as asthma exercise induced only in the setting of a patient who is highly sensitive to beta agonist both long acting and short acting Note patient is a former 42-pack-year smoker Plan Stop albuterol Stop Singulair Start Spiriva one capsule in handihaler daily Return 2 months for recheck

## 2012-12-08 NOTE — Progress Notes (Signed)
Subjective:    Patient ID: Brandy Scott, female    DOB: [birth year], 62 y.o.   MRN: [id number]  HPI 12/08/2012 At last ov we rec singulair and prn SABA b4 exercise Pt is still dyspneic.  Notes some cough, if lose breath. Notes ongoing wheezing.  Uses rescue inhaler, caused HR to elevate.  Stayed on singulair.  On no other inhalers    Past Medical History  Diagnosis Date  . Hypercholesteremia   . HTN (hypertension)   . Diabetes mellitus   . Gout   . COPD (chronic obstructive pulmonary disease)   . SVT (supraventricular tachycardia)      Family History  Problem Relation Age of Onset  . Hypertension Father   . Diabetes Father   . Parkinsonism Father   . Colon cancer Mother   . Alzheimer's disease Mother   . Clotting disorder Mother     had filter placed     History   Social History  . Marital Status: Married    Spouse Name: N/A    Number of Children: 2  . Years of Education: N/A   Occupational History  . unemployed    Social History Main Topics  . Smoking status: Former Smoker -- 1.00 packs/day for 42 years    Types: Cigarettes    Quit date: 05/14/2010  . Smokeless tobacco: Never Used  . Alcohol Use: No  . Drug Use: No  . Sexually Active: Not on file   Other Topics Concern  . Not on file   Social History Narrative  . No narrative on file     No Known Allergies   Outpatient Prescriptions Prior to Visit  Medication Sig Dispense Refill  . aspirin 81 MG tablet Take 81 mg by mouth daily.        . fish oil-omega-3 fatty acids 1000 MG capsule Take 2 g by mouth daily.        . hydrochlorothiazide 25 MG tablet Take 25 mg by mouth daily.        Marland Kitchen losartan (COZAAR) 100 MG tablet Take 50 mg by mouth daily.        . metFORMIN (GLUCOPHAGE) 1000 MG tablet Take 1,000 mg by mouth 2 (two) times daily with a meal.       . montelukast (SINGULAIR) 10 MG tablet Take 1 tablet (10 mg total) by mouth at bedtime.  30 tablet  6  . albuterol (PROVENTIL HFA;VENTOLIN HFA) 108  (90 BASE) MCG/ACT inhaler Inhale 2 puffs into the lungs every 6 (six) hours as needed for wheezing (prior to each exercise period).  1 Inhaler  6   No facility-administered medications prior to visit.      Review of Systems  Constitutional: Negative for unexpected weight change.  HENT: Negative for nosebleeds, congestion, sneezing, trouble swallowing, dental problem, postnasal drip and sinus pressure.   Eyes: Negative for redness and itching.  Respiratory: Positive for cough. Negative for chest tightness.   Cardiovascular: Negative for palpitations.  Gastrointestinal: Negative for nausea.  Genitourinary: Negative for dysuria.  Musculoskeletal: Negative for joint swelling.  Hematological: Bruises/bleeds easily.  Psychiatric/Behavioral: Negative for dysphoric mood. The patient is not nervous/anxious.        Objective:   Physical Exam  Filed Vitals:   12/08/12 1058  BP: 140/76  Pulse: 85  Temp: 98 F (36.7 C)  TempSrc: Oral  Height: 5' 6.5" (1.689 m)  Weight: 247 lb (112.038 kg)  SpO2: 97%    Gen: obese   in no  distress,  normal affect  ENT: No lesions,  mouth clear,  oropharynx clear, no postnasal drip  Neck: No JVD, no TMG, no carotid bruits  Lungs: No use of accessory muscles, no dullness to percussion, clear without rales or rhonchi  Cardiovascular: RRR, heart sounds normal, no murmur or gallops, no peripheral edema  Abdomen: soft and NT, no HSM,  BS normal  Musculoskeletal: No deformities, no cyanosis or clubbing  Neuro: alert, non focal  Skin: Warm, no lesions or rashes     Assessment & Plan:   COPD, moderate, Gold B Gold stage B. COPD that was formerly labeled as asthma exercise induced only in the setting of a patient who is highly sensitive to beta agonist both long acting and short acting Note patient is a former 42-pack-year smoker Plan Stop albuterol Stop Singulair Start Spiriva one capsule in handihaler daily Return 2 months for  recheck     Updated Medication List Outpatient Encounter Prescriptions as of 12/08/2012  Medication Sig Dispense Refill  . aspirin 81 MG tablet Take 81 mg by mouth daily.        . fish oil-omega-3 fatty acids 1000 MG capsule Take 2 g by mouth daily.        . hydrochlorothiazide 25 MG tablet Take 25 mg by mouth daily.        Marland Kitchen losartan (COZAAR) 100 MG tablet Take 50 mg by mouth daily.        . metFORMIN (GLUCOPHAGE) 1000 MG tablet Take 1,000 mg by mouth 2 (two) times daily with a meal.       . [DISCONTINUED] montelukast (SINGULAIR) 10 MG tablet Take 1 tablet (10 mg total) by mouth at bedtime.  30 tablet  6  . tiotropium (SPIRIVA HANDIHALER) 18 MCG inhalation capsule Place 1 capsule (18 mcg total) into inhaler and inhale daily.  30 capsule  0  . [DISCONTINUED] albuterol (PROVENTIL HFA;VENTOLIN HFA) 108 (90 BASE) MCG/ACT inhaler Inhale 2 puffs into the lungs every 6 (six) hours as needed for wheezing (prior to each exercise period).  1 Inhaler  6  . [DISCONTINUED] tiotropium (SPIRIVA HANDIHALER) 18 MCG inhalation capsule Place 1 capsule (18 mcg total) into inhaler and inhale daily.  30 capsule  6   No facility-administered encounter medications on file as of 12/08/2012.

## 2013-02-02 ENCOUNTER — Other Ambulatory Visit: Payer: Self-pay

## 2013-02-02 DIAGNOSIS — Z1231 Encounter for screening mammogram for malignant neoplasm of breast: Secondary | ICD-10-CM

## 2013-02-09 ENCOUNTER — Ambulatory Visit
Admission: RE | Admit: 2013-02-09 | Discharge: 2013-02-09 | Disposition: A | Discharge status: Home / Self Care | Payer: BC Managed Care – PPO | Source: Ambulatory Visit

## 2013-02-09 ENCOUNTER — Ambulatory Visit: Payer: BC Managed Care – PPO | Admitting: Critical Care Medicine

## 2013-02-09 DIAGNOSIS — Z1231 Encounter for screening mammogram for malignant neoplasm of breast: Secondary | ICD-10-CM

## 2013-02-19 ENCOUNTER — Ambulatory Visit: Payer: BC Managed Care – PPO | Admitting: Critical Care Medicine

## 2013-02-26 ENCOUNTER — Other Ambulatory Visit: Payer: Self-pay | Admitting: Family Medicine

## 2013-02-26 DIAGNOSIS — M25551 Pain in right hip: Secondary | ICD-10-CM

## 2013-03-05 ENCOUNTER — Ambulatory Visit
Admission: RE | Admit: 2013-03-05 | Discharge: 2013-03-05 | Disposition: A | Discharge status: Home / Self Care | Payer: Medicare Other | Source: Ambulatory Visit | Attending: Family Medicine | Admitting: Family Medicine

## 2013-03-05 DIAGNOSIS — M25551 Pain in right hip: Secondary | ICD-10-CM

## 2013-03-23 ENCOUNTER — Ambulatory Visit: Payer: Medicare Other | Attending: Family Medicine | Admitting: Physical Therapy

## 2013-03-23 DIAGNOSIS — M25559 Pain in unspecified hip: Secondary | ICD-10-CM | POA: Insufficient documentation

## 2013-03-23 DIAGNOSIS — IMO0001 Reserved for inherently not codable concepts without codable children: Secondary | ICD-10-CM | POA: Insufficient documentation

## 2013-03-26 ENCOUNTER — Ambulatory Visit: Payer: Medicare Other | Admitting: Rehabilitation

## 2013-03-31 ENCOUNTER — Ambulatory Visit: Payer: Medicare Other | Admitting: Physical Therapy

## 2013-04-02 ENCOUNTER — Ambulatory Visit (INDEPENDENT_AMBULATORY_CARE_PROVIDER_SITE_OTHER): Payer: Medicare Other | Admitting: Critical Care Medicine

## 2013-04-02 ENCOUNTER — Ambulatory Visit: Payer: Medicare Other | Admitting: Physical Therapy

## 2013-04-02 ENCOUNTER — Encounter: Payer: Self-pay | Admitting: Critical Care Medicine

## 2013-04-02 VITALS — BP 152/78 | HR 112 | Temp 98.1°F | Ht 67.0 in | Wt 244.0 lb

## 2013-04-02 DIAGNOSIS — J449 Chronic obstructive pulmonary disease, unspecified: Secondary | ICD-10-CM

## 2013-04-02 DIAGNOSIS — G8929 Other chronic pain: Secondary | ICD-10-CM | POA: Insufficient documentation

## 2013-04-02 NOTE — Progress Notes (Signed)
Subjective:    Patient ID: Brandy Scott, female    DOB: 04/19/1951, 62 y.o.   MRN: [id number]  HPI  04/02/2013 Chief Complaint  Patient presents with  . 4 month follow up    Has noticed no changed in breathing with spiriva.  Has not been able to do activitiees in 2 months d/t hip.  No wheezing, chest tightness, chest pain, or cough at this time.   At last ov started spiriva, not change noted on this med Very sensitive to LABA/SABA Has been off since [facility] June.  Since off spiriva , not very active d/t hip issues. Pt gets slowly in and out of the shower. No wheezing on or off spiriva now.    Past Medical History  Diagnosis Date  . Hypercholesteremia   . HTN (hypertension)   . Diabetes mellitus   . Gout   . COPD (chronic obstructive pulmonary disease)   . SVT (supraventricular tachycardia)      Family History  Problem Relation Age of Onset  . Hypertension Father   . Diabetes Father   . Parkinsonism Father   . Colon cancer Mother   . Alzheimer's disease Mother   . Clotting disorder Mother     had filter placed     History   Social History  . Marital Status: Married    Spouse Name: N/A    Number of Children: 2  . Years of Education: N/A   Occupational History  . unemployed    Social History Main Topics  . Smoking status: Former Smoker -- 1.00 packs/day for 42 years    Types: Cigarettes    Quit date: 05/14/2010  . Smokeless tobacco: Never Used  . Alcohol Use: No  . Drug Use: No  . Sexual Activity: Not on file   Other Topics Concern  . Not on file   Social History Narrative  . No narrative on file     No Known Allergies   Outpatient Prescriptions Prior to Visit  Medication Sig Dispense Refill  . aspirin 81 MG tablet Take 81 mg by mouth daily.        . fish oil-omega-3 fatty acids 1000 MG capsule Take 2 g by mouth daily.        . hydrochlorothiazide 25 MG tablet Take 25 mg by mouth daily.        Marland Kitchen losartan (COZAAR) 100 MG tablet Take 50 mg by mouth  daily.        . metFORMIN (GLUCOPHAGE) 1000 MG tablet Take 1,000 mg by mouth 2 (two) times daily with a meal.       . tiotropium (SPIRIVA HANDIHALER) 18 MCG inhalation capsule Place 1 capsule (18 mcg total) into inhaler and inhale daily.  30 capsule  0   No facility-administered medications prior to visit.      Review of Systems  Constitutional: Negative for unexpected weight change.  HENT: Negative for nosebleeds, congestion, sneezing, trouble swallowing, dental problem, postnasal drip and sinus pressure.   Eyes: Negative for redness and itching.  Respiratory: Positive for cough. Negative for chest tightness.   Cardiovascular: Negative for palpitations.  Gastrointestinal: Negative for nausea.  Genitourinary: Negative for dysuria.  Musculoskeletal: Negative for joint swelling.  Hematological: Bruises/bleeds easily.  Psychiatric/Behavioral: Negative for dysphoric mood. The patient is not nervous/anxious.        Objective:   Physical Exam  Filed Vitals:   04/02/13 1125  BP: 152/78  Pulse: 112  Temp: 98.1 F (36.7 C)  TempSrc:  Oral  Height: 5\' 7"  (1.702 m)  Weight: 244 lb (110.678 kg)  SpO2: 96%    Gen: obese   in no distress,  normal affect  ENT: No lesions,  mouth clear,  oropharynx clear, no postnasal drip  Neck: No JVD, no TMG, no carotid bruits  Lungs: No use of accessory muscles, no dullness to percussion, clear without rales or rhonchi  Cardiovascular: RRR, heart sounds normal, no murmur or gallops, no peripheral edema  Abdomen: soft and NT, no HSM,  BS normal  Musculoskeletal: No deformities, no cyanosis or clubbing  Neuro: alert, non focal  Skin: Warm, no lesions or rashes     Assessment & Plan:   COPD, moderate, Gold B Gold stage B. COPD with inability to tolerate long-acting and short-acting beta agonists. No real proven benefit with the use of Spiriva however the patient's activity levels are very limited due to recent hip injury on the  right  Plan Maintain off Spiriva at this time The patient will reconvene in regroup with pulmonary once her activity levels are increasing we may consider an alternative agent such as [place]orza    Updated Medication List Outpatient Encounter Prescriptions as of 04/02/2013  Medication Sig Dispense Refill  . aspirin 81 MG tablet Take 81 mg by mouth daily.        . fish oil-omega-3 fatty acids 1000 MG capsule Take 2 g by mouth daily.        . hydrochlorothiazide 25 MG tablet Take 25 mg by mouth daily.        Marland Kitchen losartan (COZAAR) 100 MG tablet Take 50 mg by mouth daily.        . metFORMIN (GLUCOPHAGE) 1000 MG tablet Take 1,000 mg by mouth 2 (two) times daily with a meal.       . oxyCODONE (OXY IR/ROXICODONE) 5 MG immediate release tablet Take 1-2 tablets by mouth at bedtime.      Marland Kitchen tiotropium (SPIRIVA HANDIHALER) 18 MCG inhalation capsule Place 1 capsule (18 mcg total) into inhaler and inhale daily.  30 capsule  0   No facility-administered encounter medications on file as of 04/02/2013.

## 2013-04-02 NOTE — Assessment & Plan Note (Signed)
Gold stage B. COPD with inability to tolerate long-acting and short-acting beta agonists. No real proven benefit with the use of Spiriva however the patient's activity levels are very limited due to recent hip injury on the right  Plan Maintain off Spiriva at this time The patient will reconvene in regroup with pulmonary once her activity levels are increasing we may consider an alternative agent such as [place]

## 2013-04-02 NOTE — Patient Instructions (Addendum)
Return as needed Stay off spiriva for now

## 2013-07-02 ENCOUNTER — Encounter (HOSPITAL_COMMUNITY): Payer: Self-pay | Admitting: Pharmacy Technician

## 2013-07-08 ENCOUNTER — Encounter (HOSPITAL_COMMUNITY): Payer: Self-pay

## 2013-07-08 ENCOUNTER — Telehealth: Payer: Self-pay | Admitting: Critical Care Medicine

## 2013-07-08 ENCOUNTER — Encounter (HOSPITAL_COMMUNITY)
Admission: RE | Admit: 2013-07-08 | Discharge: 2013-07-08 | Disposition: A | Discharge status: Home / Self Care | Payer: Medicare Other | Source: Ambulatory Visit | Attending: Orthopaedic Surgery | Admitting: Orthopaedic Surgery

## 2013-07-08 DIAGNOSIS — Z01818 Encounter for other preprocedural examination: Secondary | ICD-10-CM | POA: Insufficient documentation

## 2013-07-08 DIAGNOSIS — Z01812 Encounter for preprocedural laboratory examination: Secondary | ICD-10-CM | POA: Insufficient documentation

## 2013-07-08 HISTORY — DX: Shortness of breath: R06.02

## 2013-07-08 HISTORY — DX: Anxiety disorder, unspecified: F41.9

## 2013-07-08 LAB — BASIC METABOLIC PANEL
BUN: 11 mg/dL (ref 6–23)
CO2: 26 mEq/L (ref 19–32)
Calcium: 10 mg/dL (ref 8.4–10.5)
Chloride: 97 mEq/L (ref 96–112)
Creatinine, Ser: 0.57 mg/dL (ref 0.50–1.10)
GFR calc Af Amer: >90 mL/min (ref >90)
GFR calc non Af Amer: >90 mL/min (ref >90)
Glucose, Bld: 114 mg/dL — ABNORMAL HIGH (ref 70–99)
Potassium: 3.5 mEq/L (ref 3.5–5.1)
Sodium: 136 mEq/L (ref 135–145)

## 2013-07-08 LAB — TYPE AND SCREEN
ABO/RH(D): O POS
Antibody Screen: NEGATIVE

## 2013-07-08 LAB — SURGICAL PCR SCREEN
MRSA, PCR: NEGATIVE
Staphylococcus aureus: POSITIVE — AB

## 2013-07-08 LAB — CBC
HCT: 42.2 % (ref 36.0–46.0)
Hemoglobin: 15 g/dL (ref 12.0–15.0)
MCH: 30.4 pg (ref 26.0–34.0)
MCHC: 35.5 g/dL (ref 30.0–36.0)
MCV: 85.6 fL (ref 78.0–100.0)
Platelets: 169 10*3/uL (ref 150–400)
RBC: 4.93 MIL/uL (ref 3.87–5.11)
RDW: 12.8 % (ref 11.5–15.5)
WBC: 9.2 10*3/uL (ref 4.0–10.5)

## 2013-07-08 LAB — ABO/RH: ABO/RH(D): O POS

## 2013-07-08 NOTE — Pre-Procedure Instructions (Signed)
Brandy Scott  07/08/2013   Your procedure is scheduled on:  07/16/13  Report to [facility]  2 * 3 at 930 AM.  Call this number if you have problems the morning of surgery: [phone]   Remember:   Do not eat food or drink liquids after midnight.   Take these medicines the morning of surgery with A SIP OF WATER: none   Do not wear jewelry, make-up or nail polish.  Do not wear lotions, powders, or perfumes. You may wear deodorant.  Do not shave 48 hours prior to surgery. Men may shave face and neck.  Do not bring valuables to the hospital.  [facility] is not responsible                  for any belongings or valuables.               Contacts, dentures or bridgework may not be worn into surgery.  Leave suitcase in the car. After surgery it may be brought to your room.  For patients admitted to the hospital, discharge time is determined by your                treatment team.               Patients discharged the day of surgery will not be allowed to drive  home.  Name and phone number of your driver: family  Special Instructions: Incentive Spirometry - Practice and bring it with you on the day of surgery.   Please read over the following fact sheets that you were given: Pain Booklet, Coughing and Deep Breathing, Blood Transfusion Information, Open Heart Packet, MRSA Information and Surgical Site Infection Prevention   

## 2013-07-08 NOTE — Pre-Procedure Instructions (Signed)
Brandy Scott  07/08/2013   Your procedure is scheduled on:  07/16/13  Report to [name]  2 * 3 at 930 AM.  Call this number if you have problems the morning of surgery: [phone]   Remember:   Do not eat food or drink liquids after midnight.   Take these medicines the morning of surgery with A SIP OF WATER: none   Do not wear jewelry, make-up or nail polish.  Do not wear lotions, powders, or perfumes. You may wear deodorant.  Do not shave 48 hours prior to surgery. Men may shave face and neck.  Do not bring valuables to the hospital.  [name] is not responsible                  for any belongings or valuables.               Contacts, dentures or bridgework may not be worn into surgery.  Leave suitcase in the car. After surgery it may be brought to your room.  For patients admitted to the hospital, discharge time is determined by your                treatment team.               Patients discharged the day of surgery will not be allowed to drive  home.  Name and phone number of your driver: family  Special Instructions: Incentive Spirometry - Practice and bring it with you on the day of surgery.   Please read over the following fact sheets that you were given: Pain Booklet, Coughing and Deep Breathing, Blood Transfusion Information, Open Heart Packet, MRSA Information and Surgical Site Infection Prevention

## 2013-07-08 NOTE — Telephone Encounter (Signed)
Spoke with Crystal, no sign of the surgery clearance. Called Sherri, left detailed message on named and confidential voicemail informing her that we are unable to locate the surgery clearance and asked that this be refaxed to the triage fax machine attn Dr Delford Field and Aggie Cosier.  Ceasar Lund of our holiday schedule and that PW will be in the office on 11.28 and 12.1  Will hold in triage to watch out for the fax

## 2013-07-08 NOTE — Pre-Procedure Instructions (Signed)
Brandy Scott  07/08/2013   Your procedure is scheduled on:  07/16/13  Report to [name]  2 * 3 at 930 AM.  Call this number if you have problems the morning of surgery: [phone]   Remember:   Do not eat food or drink liquids after midnight.   Take these medicines the morning of surgery with A SIP OF WATER: none   Do not wear jewelry, make-up or nail polish.  Do not wear lotions, powders, or perfumes. You may wear deodorant.  Do not shave 48 hours prior to surgery. Men may shave face and neck.  Do not bring valuables to the hospital.  [facility] is not responsible                  for any belongings or valuables.               Contacts, dentures or bridgework may not be worn into surgery.  Leave suitcase in the car. After surgery it may be brought to your room.  For patients admitted to the hospital, discharge time is determined by your                treatment team.               Patients discharged the day of surgery will not be allowed to drive  home.  Name and phone number of your driver: family  Special Instructions: Shower using CHG 2 nights before surgery and the night before surgery.  If you shower the day of surgery use CHG.  Use special wash - you have one bottle of CHG for all showers.  You should use approximately 1/3 of the bottle for each shower.   Please read over the following fact sheets that you were given: Pain Booklet, Coughing and Deep Breathing, Blood Transfusion Information, MRSA Information and Surgical Site Infection Prevention

## 2013-07-08 NOTE — Telephone Encounter (Signed)
Fax received from [facility] for surgery clearance for total right hip replacement on 12.4.14 Handed to [place] to ensure PW receives this when he comes back to the office on 11.28.14 Will forward message to him as well

## 2013-07-09 NOTE — Telephone Encounter (Signed)
Will sign form.  She is clear for surgery pulm wise

## 2013-07-10 NOTE — Telephone Encounter (Signed)
Left detailed message on Brandy Scott's VM. Advised also will fax form once signed. Nothing further needded

## 2013-07-13 NOTE — Progress Notes (Addendum)
Anesthesia Chart Review:  Patient is a 62 year old female scheduled for right THA, anterior approach on 07/16/13 by Dr. Roda Shutters.  Anesthesia is posted as spinal.    History includes former smoker, HTN, COPD (moderate, Gold B) with inability to tolerate long-acting or short-acting beta agonists and being maintained on Spiriva currently, chronic SOB, hypercholesterolemia, anxiety, DM2, SVT, obesity, gout, normal coronaries by cath on 05/08/11.  Pulmonologist is Dr. Shan Levans who cleared patient for this procedure from a pulmonary standpoint.  She was evaluated by cardiologist Dr. Clifton James in the fall of 2012 for evaluation of chest pain, dyspnea, and fluttering following a cardiopulmonary stress test that showed diffuse T wave inversion and hypertensive response and cardiac etiology wanted to be ruled out.  She ultimately had a normal cardiac cath and a holter monitor that showed short bursts of SVT. (See below.) PCP is listed as Dr. Laurann Montana who medically cleared patient for this procedure.  CPST on 04/02/11 showed: Conclusion: Exercise testing with gas exchange demonstrates a normal functional capacity when compared to matched sedentary norms. There is evidence to suggest her obesity is contributing to her overall exercise intolerance. However, the significant EIB response in post-exercise spirometry is the major contributor for her to maintain sustained exercise. In summary, dyspnea due to obesity but also strongly positive for asthma. In addition, at rest she had diffuse T wave inversion and had hypertensive response. I think she obesity, asthma, hypertensive response and ? Occult CAD are contributing to dyspnea.  She subsequently underwent a cardiac cath on 05/08/11 that showed: 1. No angiographic evidence of coronary artery disease.  2. Normal left ventricular systolic function.  3. Normal left ventricular filling pressures.  4. Noncardiac chest pain. No further ischemia work-up recommended at that  time.  48 Hour Holter Monitor showed SR with occasional PACs, several short runs of SVT.  No afib, VT or ventricular ectopy. She did not want to start a b-blocker or calcium channel blocker at that time, so avoidance of caffeine and other stimulants was recommended with plans to start medication only if symptoms worsened.    EKG on 07/08/13 showed NSR, low voltage QRS, T wave abnormality, consider lateral ischemia.  T wave abnormality is less pronounced when compared to her baseline CPST EKG on 04/02/11 that showed rather diffuse ST/T wave abnormality particularly in the inferolateral leads.   CXR on 07/08/13 showed no active cardiopulmonary disease.  PFTs (best of three) on 12/08/12 showed FVC 2.38 (70%), FEV1 1.72 (65%).  Mid restriction.   Preoperative labs noted.  PT/PTT were not done at PAT, so will need to get them on the day of surgery. Orders were pending at the time of her PAT visit, so any additional orders per her surgeon will need to be done on the day of surgery as well.  She has lateral T wave abnormality, but has been present since at least 03/2011, and she has had a normal cardiac cath since.  There is no ectopy on EKG. BP was well controlled at PAT.  She has been cleared by pulmonology.  If no acute changes then I would anticipate that she could proceed as planned.  Velna Ochs [facility] Short Stay Center/Anesthesiology Phone [phone] 07/13/2013 10:25 AM

## 2013-07-14 ENCOUNTER — Other Ambulatory Visit (HOSPITAL_COMMUNITY): Payer: Self-pay | Admitting: Orthopaedic Surgery

## 2013-07-14 NOTE — Progress Notes (Signed)
Voicemail left for Sherri, at Dr. Roda Shutters office, to have MD sign & release preop orders.

## 2013-07-15 MED ORDER — CEFAZOLIN SODIUM-DEXTROSE 2-3 GM-% IV SOLR
2.0000 g | INTRAVENOUS | Status: AC
  Administered 2013-07-16: 2 g via INTRAVENOUS
  Filled 2013-07-15: qty 50

## 2013-07-16 ENCOUNTER — Inpatient Hospital Stay (HOSPITAL_COMMUNITY)
Admission: RE | Admit: 2013-07-16 | Discharge: 2013-07-18 | DRG: 470 | Disposition: A | Discharge status: Home Health | Payer: Medicare Other | Source: Ambulatory Visit | Attending: Orthopaedic Surgery | Admitting: Orthopaedic Surgery

## 2013-07-16 ENCOUNTER — Encounter (HOSPITAL_COMMUNITY)
Admission: RE | Disposition: A | Discharge status: Home Health | Payer: Self-pay | Source: Ambulatory Visit | Attending: Orthopaedic Surgery

## 2013-07-16 ENCOUNTER — Inpatient Hospital Stay (HOSPITAL_COMMUNITY): Payer: Medicare Other

## 2013-07-16 ENCOUNTER — Encounter (HOSPITAL_COMMUNITY): Payer: Medicare Other | Admitting: Vascular Surgery

## 2013-07-16 ENCOUNTER — Inpatient Hospital Stay (HOSPITAL_COMMUNITY): Payer: Medicare Other | Admitting: Anesthesiology

## 2013-07-16 ENCOUNTER — Encounter (HOSPITAL_COMMUNITY): Payer: Self-pay

## 2013-07-16 DIAGNOSIS — Z87891 Personal history of nicotine dependence: Secondary | ICD-10-CM

## 2013-07-16 DIAGNOSIS — E119 Type 2 diabetes mellitus without complications: Secondary | ICD-10-CM | POA: Diagnosis present

## 2013-07-16 DIAGNOSIS — M1610 Unilateral primary osteoarthritis, unspecified hip: Principal | ICD-10-CM | POA: Diagnosis present

## 2013-07-16 DIAGNOSIS — M109 Gout, unspecified: Secondary | ICD-10-CM | POA: Diagnosis present

## 2013-07-16 DIAGNOSIS — M169 Osteoarthritis of hip, unspecified: Principal | ICD-10-CM | POA: Diagnosis present

## 2013-07-16 DIAGNOSIS — M1611 Unilateral primary osteoarthritis, right hip: Secondary | ICD-10-CM

## 2013-07-16 DIAGNOSIS — J449 Chronic obstructive pulmonary disease, unspecified: Secondary | ICD-10-CM | POA: Diagnosis present

## 2013-07-16 DIAGNOSIS — I10 Essential (primary) hypertension: Secondary | ICD-10-CM | POA: Diagnosis present

## 2013-07-16 DIAGNOSIS — E7800 Pure hypercholesterolemia, unspecified: Secondary | ICD-10-CM | POA: Diagnosis present

## 2013-07-16 DIAGNOSIS — F411 Generalized anxiety disorder: Secondary | ICD-10-CM | POA: Diagnosis present

## 2013-07-16 DIAGNOSIS — J4489 Other specified chronic obstructive pulmonary disease: Secondary | ICD-10-CM | POA: Diagnosis present

## 2013-07-16 DIAGNOSIS — Z7982 Long term (current) use of aspirin: Secondary | ICD-10-CM

## 2013-07-16 HISTORY — PX: TOTAL HIP ARTHROPLASTY: SHX124

## 2013-07-16 LAB — HEPATIC FUNCTION PANEL
ALT: 22 U/L (ref 0–35)
AST: 20 U/L (ref 0–37)
Albumin: 3.7 g/dL (ref 3.5–5.2)
Alkaline Phosphatase: 79 U/L (ref 39–117)
Bilirubin, Direct: <0.1 mg/dL (ref 0.0–0.3)
Total Bilirubin: 0.2 mg/dL — ABNORMAL LOW (ref 0.3–1.2)
Total Protein: 7 g/dL (ref 6.0–8.3)

## 2013-07-16 LAB — URINALYSIS, ROUTINE W REFLEX MICROSCOPIC
Bilirubin Urine: NEGATIVE
Glucose, UA: NEGATIVE mg/dL
Hgb urine dipstick: NEGATIVE
Ketones, ur: NEGATIVE mg/dL
Leukocytes, UA: NEGATIVE
Nitrite: NEGATIVE
Protein, ur: 30 mg/dL — AB
Specific Gravity, Urine: 1.022 (ref 1.005–1.030)
Urobilinogen, UA: 0.2 mg/dL (ref 0.0–1.0)
pH: 5.5 (ref 5.0–8.0)

## 2013-07-16 LAB — URINE MICROSCOPIC-ADD ON

## 2013-07-16 LAB — GLUCOSE, CAPILLARY
Glucose-Capillary: 131 mg/dL — ABNORMAL HIGH (ref 70–99)
Glucose-Capillary: 139 mg/dL — ABNORMAL HIGH (ref 70–99)
Glucose-Capillary: 183 mg/dL — ABNORMAL HIGH (ref 70–99)

## 2013-07-16 LAB — SEDIMENTATION RATE: Sed Rate: 17 mm/hr (ref 0–22)

## 2013-07-16 LAB — PROTIME-INR
INR: 0.9 (ref 0.00–1.49)
Prothrombin Time: 12 seconds (ref 11.6–15.2)

## 2013-07-16 LAB — CALCIUM: Calcium: 9.5 mg/dL (ref 8.4–10.5)

## 2013-07-16 LAB — APTT: aPTT: 29 seconds (ref 24–37)

## 2013-07-16 LAB — C-REACTIVE PROTEIN: CRP: 0.7 mg/dL — ABNORMAL HIGH (ref <0.60)

## 2013-07-16 LAB — PREALBUMIN: Prealbumin: 23.8 mg/dL (ref 17.0–34.0)

## 2013-07-16 SURGERY — ARTHROPLASTY, HIP, TOTAL, ANTERIOR APPROACH
Anesthesia: General | Site: Hip | Laterality: Right

## 2013-07-16 MED ORDER — SODIUM CHLORIDE 0.9 % IR SOLN
Status: DC | PRN
  Administered 2013-07-16: 3000 mL

## 2013-07-16 MED ORDER — PROPOFOL 10 MG/ML IV BOLUS
INTRAVENOUS | Status: DC | PRN
  Administered 2013-07-16: 200 mg via INTRAVENOUS

## 2013-07-16 MED ORDER — HYDROCHLOROTHIAZIDE 25 MG PO TABS
25.0000 mg | ORAL_TABLET | Freq: Every day | ORAL | Status: DC
  Administered 2013-07-16 – 2013-07-18 (×3): 25 mg via ORAL
  Filled 2013-07-16 (×3): qty 1

## 2013-07-16 MED ORDER — CELECOXIB 200 MG PO CAPS
200.0000 mg | ORAL_CAPSULE | Freq: Two times a day (BID) | ORAL | Status: DC
  Administered 2013-07-16 – 2013-07-18 (×4): 200 mg via ORAL
  Filled 2013-07-16 (×5): qty 1

## 2013-07-16 MED ORDER — MENTHOL 3 MG MT LOZG: 1.0000 | LOZENGE | OROMUCOSAL | Status: DC | PRN

## 2013-07-16 MED ORDER — SODIUM CHLORIDE 0.9 % IV SOLN
INTRAVENOUS | Status: DC
  Administered 2013-07-16: 18:00:00 via INTRAVENOUS

## 2013-07-16 MED ORDER — LOSARTAN POTASSIUM-HCTZ 100-25 MG PO TABS: 1.0000 | ORAL_TABLET | Freq: Every day | ORAL | Status: DC

## 2013-07-16 MED ORDER — METHOCARBAMOL 500 MG PO TABS
500.0000 mg | ORAL_TABLET | Freq: Four times a day (QID) | ORAL | Status: DC | PRN
  Administered 2013-07-16: 500 mg via ORAL
  Filled 2013-07-16: qty 1

## 2013-07-16 MED ORDER — METOCLOPRAMIDE HCL 5 MG/ML IJ SOLN
5.0000 mg | Freq: Three times a day (TID) | INTRAMUSCULAR | Status: DC | PRN
  Administered 2013-07-16: 10 mg via INTRAVENOUS
  Filled 2013-07-16: qty 2

## 2013-07-16 MED ORDER — LACTATED RINGERS IV SOLN
INTRAVENOUS | Status: DC
  Administered 2013-07-16 (×2): via INTRAVENOUS

## 2013-07-16 MED ORDER — ACETAMINOPHEN 500 MG PO TABS
1000.0000 mg | ORAL_TABLET | Freq: Four times a day (QID) | ORAL | Status: AC
  Administered 2013-07-16 – 2013-07-17 (×4): 1000 mg via ORAL
  Filled 2013-07-16 (×4): qty 2

## 2013-07-16 MED ORDER — SORBITOL 70 % SOLN: 30.0000 mL | Freq: Every day | Status: DC | PRN

## 2013-07-16 MED ORDER — FENTANYL CITRATE 0.05 MG/ML IJ SOLN
INTRAMUSCULAR | Status: DC | PRN
  Administered 2013-07-16: 50 ug via INTRAVENOUS
  Administered 2013-07-16: 150 ug via INTRAVENOUS
  Administered 2013-07-16 (×3): 50 ug via INTRAVENOUS

## 2013-07-16 MED ORDER — OXYCODONE HCL ER 10 MG PO T12A
10.0000 mg | EXTENDED_RELEASE_TABLET | Freq: Two times a day (BID) | ORAL | Status: DC
  Administered 2013-07-16 – 2013-07-18 (×4): 10 mg via ORAL
  Filled 2013-07-16 (×4): qty 1

## 2013-07-16 MED ORDER — ONDANSETRON HCL 4 MG/2ML IJ SOLN
4.0000 mg | Freq: Four times a day (QID) | INTRAMUSCULAR | Status: DC | PRN
  Administered 2013-07-16: 4 mg via INTRAVENOUS
  Filled 2013-07-16: qty 2

## 2013-07-16 MED ORDER — SENNA 8.6 MG PO TABS
1.0000 | ORAL_TABLET | Freq: Two times a day (BID) | ORAL | Status: DC
  Administered 2013-07-16 – 2013-07-18 (×4): 8.6 mg via ORAL
  Filled 2013-07-16 (×5): qty 1

## 2013-07-16 MED ORDER — 0.9 % SODIUM CHLORIDE (POUR BTL) OPTIME
TOPICAL | Status: DC | PRN
  Administered 2013-07-16: 1000 mL

## 2013-07-16 MED ORDER — HYDROMORPHONE HCL PF 1 MG/ML IJ SOLN
0.2500 mg | INTRAMUSCULAR | Status: DC | PRN
  Administered 2013-07-16 (×4): 0.5 mg via INTRAVENOUS

## 2013-07-16 MED ORDER — ACETAMINOPHEN 325 MG PO TABS: 650.0000 mg | ORAL_TABLET | Freq: Four times a day (QID) | ORAL | Status: DC | PRN

## 2013-07-16 MED ORDER — ROCURONIUM BROMIDE 100 MG/10ML IV SOLN
INTRAVENOUS | Status: DC | PRN
  Administered 2013-07-16: 50 mg via INTRAVENOUS

## 2013-07-16 MED ORDER — ONDANSETRON HCL 4 MG PO TABS: 4.0000 mg | ORAL_TABLET | Freq: Four times a day (QID) | ORAL | Status: DC | PRN

## 2013-07-16 MED ORDER — NEOSTIGMINE METHYLSULFATE 1 MG/ML IJ SOLN
INTRAMUSCULAR | Status: DC | PRN
  Administered 2013-07-16: 3 mg via INTRAVENOUS

## 2013-07-16 MED ORDER — ACETAMINOPHEN 650 MG RE SUPP: 650.0000 mg | Freq: Four times a day (QID) | RECTAL | Status: DC | PRN

## 2013-07-16 MED ORDER — OMEGA-3 FATTY ACIDS 1000 MG PO CAPS: 1.0000 g | ORAL_CAPSULE | Freq: Every day | ORAL | Status: DC

## 2013-07-16 MED ORDER — MORPHINE SULFATE 2 MG/ML IJ SOLN: 2.0000 mg | INTRAMUSCULAR | Status: DC | PRN

## 2013-07-16 MED ORDER — KETOROLAC TROMETHAMINE 15 MG/ML IJ SOLN
15.0000 mg | Freq: Four times a day (QID) | INTRAMUSCULAR | Status: DC | PRN
  Filled 2013-07-16: qty 1

## 2013-07-16 MED ORDER — INSULIN ASPART 100 UNIT/ML ~~LOC~~ SOLN
6.0000 [IU] | Freq: Three times a day (TID) | SUBCUTANEOUS | Status: DC
  Administered 2013-07-17 – 2013-07-18 (×4): 6 [IU] via SUBCUTANEOUS

## 2013-07-16 MED ORDER — MIDAZOLAM HCL 5 MG/5ML IJ SOLN
INTRAMUSCULAR | Status: DC | PRN
  Administered 2013-07-16: 2 mg via INTRAVENOUS

## 2013-07-16 MED ORDER — METOCLOPRAMIDE HCL 10 MG PO TABS: 5.0000 mg | ORAL_TABLET | Freq: Three times a day (TID) | ORAL | Status: DC | PRN

## 2013-07-16 MED ORDER — PREGABALIN 50 MG PO CAPS
75.0000 mg | ORAL_CAPSULE | Freq: Two times a day (BID) | ORAL | Status: DC
  Administered 2013-07-16 – 2013-07-18 (×4): 75 mg via ORAL
  Filled 2013-07-16 (×8): qty 1

## 2013-07-16 MED ORDER — OXYCODONE HCL 5 MG PO TABS
ORAL_TABLET | ORAL | Status: AC
  Administered 2013-07-16: 5 mg
  Filled 2013-07-16: qty 2

## 2013-07-16 MED ORDER — OXYCODONE HCL 5 MG PO TABS: 5.0000 mg | ORAL_TABLET | Freq: Once | ORAL | Status: DC | PRN

## 2013-07-16 MED ORDER — ONDANSETRON HCL 4 MG/2ML IJ SOLN
INTRAMUSCULAR | Status: DC | PRN
  Administered 2013-07-16: 4 mg via INTRAVENOUS

## 2013-07-16 MED ORDER — OXYCODONE HCL 5 MG PO TABS: 5.0000 mg | ORAL_TABLET | ORAL | Status: DC | PRN

## 2013-07-16 MED ORDER — HYDROMORPHONE HCL PF 1 MG/ML IJ SOLN
INTRAMUSCULAR | Status: AC
  Administered 2013-07-16: 0.5 mg via INTRAVENOUS
  Filled 2013-07-16: qty 1

## 2013-07-16 MED ORDER — OMEGA-3-ACID ETHYL ESTERS 1 G PO CAPS
1.0000 g | ORAL_CAPSULE | Freq: Every day | ORAL | Status: DC
  Administered 2013-07-16 – 2013-07-18 (×3): 1 g via ORAL
  Filled 2013-07-16 (×3): qty 1

## 2013-07-16 MED ORDER — TRANEXAMIC ACID 100 MG/ML IV SOLN
1000.0000 mg | INTRAVENOUS | Status: AC
  Administered 2013-07-16: 1000 mg via INTRAVENOUS
  Filled 2013-07-16: qty 10

## 2013-07-16 MED ORDER — METHOCARBAMOL 100 MG/ML IJ SOLN
500.0000 mg | Freq: Four times a day (QID) | INTRAVENOUS | Status: DC | PRN
  Filled 2013-07-16: qty 5

## 2013-07-16 MED ORDER — METHOCARBAMOL 500 MG PO TABS
ORAL_TABLET | ORAL | Status: AC
  Filled 2013-07-16: qty 1

## 2013-07-16 MED ORDER — PROMETHAZINE HCL 25 MG/ML IJ SOLN: 6.2500 mg | INTRAMUSCULAR | Status: DC | PRN

## 2013-07-16 MED ORDER — INSULIN ASPART 100 UNIT/ML ~~LOC~~ SOLN
0.0000 [IU] | Freq: Three times a day (TID) | SUBCUTANEOUS | Status: DC
  Administered 2013-07-17: 2 [IU] via SUBCUTANEOUS
  Administered 2013-07-17: 3 [IU] via SUBCUTANEOUS
  Administered 2013-07-18: 2 [IU] via SUBCUTANEOUS

## 2013-07-16 MED ORDER — CEFAZOLIN SODIUM-DEXTROSE 2-3 GM-% IV SOLR
2.0000 g | Freq: Four times a day (QID) | INTRAVENOUS | Status: AC
  Administered 2013-07-16 – 2013-07-17 (×2): 2 g via INTRAVENOUS
  Filled 2013-07-16 (×2): qty 50

## 2013-07-16 MED ORDER — MAGNESIUM CITRATE PO SOLN: 1.0000 | Freq: Once | ORAL | Status: AC | PRN

## 2013-07-16 MED ORDER — KETOROLAC TROMETHAMINE 30 MG/ML IJ SOLN
INTRAMUSCULAR | Status: AC
  Administered 2013-07-16: 15 mg
  Filled 2013-07-16: qty 1

## 2013-07-16 MED ORDER — INSULIN ASPART 100 UNIT/ML ~~LOC~~ SOLN: 0.0000 [IU] | Freq: Every day | SUBCUTANEOUS | Status: DC

## 2013-07-16 MED ORDER — GLYCOPYRROLATE 0.2 MG/ML IJ SOLN
INTRAMUSCULAR | Status: DC | PRN
  Administered 2013-07-16: 0.4 mg via INTRAVENOUS

## 2013-07-16 MED ORDER — OXYCODONE HCL 5 MG/5ML PO SOLN: 5.0000 mg | Freq: Once | ORAL | Status: DC | PRN

## 2013-07-16 MED ORDER — ASPIRIN EC 325 MG PO TBEC
325.0000 mg | DELAYED_RELEASE_TABLET | Freq: Two times a day (BID) | ORAL | Status: DC
  Administered 2013-07-16 – 2013-07-18 (×4): 325 mg via ORAL
  Filled 2013-07-16 (×6): qty 1

## 2013-07-16 MED ORDER — ALUM & MAG HYDROXIDE-SIMETH 200-200-20 MG/5ML PO SUSP: 30.0000 mL | ORAL | Status: DC | PRN

## 2013-07-16 MED ORDER — LIDOCAINE HCL (CARDIAC) 20 MG/ML IV SOLN
INTRAVENOUS | Status: DC | PRN
  Administered 2013-07-16: 80 mg via INTRAVENOUS

## 2013-07-16 MED ORDER — PHENOL 1.4 % MT LIQD: 1.0000 | OROMUCOSAL | Status: DC | PRN

## 2013-07-16 MED ORDER — POLYETHYLENE GLYCOL 3350 17 G PO PACK: 17.0000 g | PACK | Freq: Every day | ORAL | Status: DC | PRN

## 2013-07-16 MED ORDER — HYDROMORPHONE HCL 2 MG PO TABS: 2.0000 mg | ORAL_TABLET | ORAL | Status: DC | PRN

## 2013-07-16 MED ORDER — DIPHENHYDRAMINE HCL 12.5 MG/5ML PO ELIX: 25.0000 mg | ORAL_SOLUTION | ORAL | Status: DC | PRN

## 2013-07-16 MED ORDER — LOSARTAN POTASSIUM 50 MG PO TABS
100.0000 mg | ORAL_TABLET | Freq: Every day | ORAL | Status: DC
  Administered 2013-07-16 – 2013-07-18 (×3): 100 mg via ORAL
  Filled 2013-07-16 (×3): qty 2

## 2013-07-16 SURGICAL SUPPLY — 55 items
BANDAGE GAUZE ELAST BULKY 4 IN (GAUZE/BANDAGES/DRESSINGS) IMPLANT
BLADE SAW SGTL 18X1.27X75 (BLADE) ×2 IMPLANT
BLADE SURG ROTATE 9660 (MISCELLANEOUS) IMPLANT
CELLS DAT CNTRL 66122 CELL SVR (MISCELLANEOUS) ×1 IMPLANT
CLOTH BEACON ORANGE TIMEOUT ST (SAFETY) ×2 IMPLANT
COVER BACK TABLE 24X17X13 BIG (DRAPES) IMPLANT
COVER SURGICAL LIGHT HANDLE (MISCELLANEOUS) ×2 IMPLANT
DERMABOND ADHESIVE PROPEN (GAUZE/BANDAGES/DRESSINGS) ×1
DERMABOND ADVANCED .7 DNX6 (GAUZE/BANDAGES/DRESSINGS) ×1 IMPLANT
DRAPE C-ARM 42X72 X-RAY (DRAPES) ×2 IMPLANT
DRAPE STERI IOBAN 125X83 (DRAPES) ×2 IMPLANT
DRAPE U-SHAPE 47X51 STRL (DRAPES) ×6 IMPLANT
DRSG AQUACEL AG ADV 3.5X10 (GAUZE/BANDAGES/DRESSINGS) ×2 IMPLANT
DURAPREP 26ML APPLICATOR (WOUND CARE) ×2 IMPLANT
ELECT BLADE 4.0 EZ CLEAN MEGAD (MISCELLANEOUS)
ELECT BLADE 6.5 EXT (BLADE) IMPLANT
ELECT BLADE TIP CTD 4 INCH (ELECTRODE) ×2 IMPLANT
ELECT CAUTERY BLADE 6.4 (BLADE) ×2 IMPLANT
ELECT REM PT RETURN 9FT ADLT (ELECTROSURGICAL) ×2
ELECTRODE BLDE 4.0 EZ CLN MEGD (MISCELLANEOUS) IMPLANT
ELECTRODE REM PT RTRN 9FT ADLT (ELECTROSURGICAL) ×1 IMPLANT
FACESHIELD LNG OPTICON STERILE (SAFETY) ×4 IMPLANT
GAUZE XEROFORM 1X8 LF (GAUZE/BANDAGES/DRESSINGS) ×2 IMPLANT
GLOVE BIOGEL PI IND STRL 8 (GLOVE) ×2 IMPLANT
GLOVE BIOGEL PI INDICATOR 8 (GLOVE) ×2
GLOVE ECLIPSE 8.0 STRL XLNG CF (GLOVE) ×2 IMPLANT
GLOVE ORTHO TXT STRL SZ7.5 (GLOVE) ×4 IMPLANT
GLOVE SURG ORTHO 8.0 STRL STRW (GLOVE) ×2 IMPLANT
GOWN STRL REIN XL XLG (GOWN DISPOSABLE) ×4 IMPLANT
HANDPIECE INTERPULSE COAX TIP (DISPOSABLE) ×1
HIP FOROUS FEM W/OX HD RS XLPE ×2 IMPLANT
KIT BASIN OR (CUSTOM PROCEDURE TRAY) ×2 IMPLANT
KIT ROOM TURNOVER OR (KITS) ×2 IMPLANT
MANIFOLD NEPTUNE II (INSTRUMENTS) ×2 IMPLANT
NS IRRIG 1000ML POUR BTL (IV SOLUTION) ×2 IMPLANT
PACK TOTAL JOINT (CUSTOM PROCEDURE TRAY) ×2 IMPLANT
PAD ARMBOARD 7.5X6 YLW CONV (MISCELLANEOUS) ×4 IMPLANT
REFLEXTION FLEXIBLE DRILL 25MM ×2 IMPLANT
RTRCTR WOUND ALEXIS 18CM MED (MISCELLANEOUS) ×2
SET HNDPC FAN SPRY TIP SCT (DISPOSABLE) ×1 IMPLANT
SPONGE LAP 18X18 X RAY DECT (DISPOSABLE) IMPLANT
SPONGE LAP 4X18 X RAY DECT (DISPOSABLE) IMPLANT
STAPLER VISISTAT 35W (STAPLE) ×2 IMPLANT
SUT ETHIBOND NAB CT1 #1 30IN (SUTURE) ×4 IMPLANT
SUT MNCRL AB 3-0 PS2 27 (SUTURE) ×2 IMPLANT
SUT VIC AB 0 CT1 27 (SUTURE) ×2
SUT VIC AB 0 CT1 27XBRD ANBCTR (SUTURE) ×2 IMPLANT
SUT VIC AB 1 CT1 27 (SUTURE) ×2
SUT VIC AB 1 CT1 27XBRD ANBCTR (SUTURE) ×2 IMPLANT
SUT VIC AB 2-0 CT1 27 (SUTURE) ×2
SUT VIC AB 2-0 CT1 TAPERPNT 27 (SUTURE) ×2 IMPLANT
TOWEL OR 17X24 6PK STRL BLUE (TOWEL DISPOSABLE) ×2 IMPLANT
TOWEL OR 17X26 10 PK STRL BLUE (TOWEL DISPOSABLE) ×2 IMPLANT
TRAY FOLEY CATH 16FRSI W/METER (SET/KITS/TRAYS/PACK) IMPLANT
WATER STERILE IRR 1000ML POUR (IV SOLUTION) ×4 IMPLANT

## 2013-07-16 NOTE — Anesthesia Postprocedure Evaluation (Signed)
  Anesthesia Post-op Note  Patient: Brandy Scott  Procedure(s) Performed: Procedure(s): RIGHT TOTAL HIP ARTHROPLASTY ANTERIOR APPROACH (Right)  Patient Location: PACU  Anesthesia Type:General  Level of Consciousness: awake, alert , oriented and patient cooperative  Airway and Oxygen Therapy: Patient Spontanous Breathing and Patient connected to nasal cannula oxygen  Post-op Pain: mild  Post-op Assessment: Post-op Vital signs reviewed, Patient's Cardiovascular Status Stable, Respiratory Function Stable, Patent Airway, No signs of Nausea or vomiting and Pain level controlled  Post-op Vital Signs: Reviewed and stable  Complications: No apparent anesthesia complications

## 2013-07-16 NOTE — Preoperative (Signed)
Beta Blockers   Reason not to administer Beta Blockers:Not Applicable 

## 2013-07-16 NOTE — Anesthesia Preprocedure Evaluation (Addendum)
Anesthesia Evaluation  Patient identified by MRN, date of birth, ID band Patient awake    Reviewed: Allergy & Precautions, H&P , NPO status , Patient's Chart, lab work & pertinent test results  Airway Mallampati: II TM Distance: >3 FB Neck ROM: Full    Dental  (+) Teeth Intact   Pulmonary shortness of breath and with exertion, COPD COPD inhaler, former smoker,    Pulmonary exam normal       Cardiovascular hypertension, + dysrhythmias Supra Ventricular Tachycardia     Neuro/Psych PSYCHIATRIC DISORDERS Anxiety negative neurological ROS     GI/Hepatic negative GI ROS, Neg liver ROS,   Endo/Other  diabetes, Oral Hypoglycemic Agents  Renal/GU negative Renal ROS     Musculoskeletal   Abdominal   Peds  Hematology   Anesthesia Other Findings   Reproductive/Obstetrics                          Anesthesia Physical Anesthesia Plan  ASA: III  Anesthesia Plan: General   Post-op Pain Management:    Induction: Intravenous  Airway Management Planned: Oral ETT  Additional Equipment:   Intra-op Plan:   Post-operative Plan: Extubation in OR  Informed Consent: I have reviewed the patients History and Physical, chart, labs and discussed the procedure including the risks, benefits and alternatives for the proposed anesthesia with the patient or authorized representative who has indicated his/her understanding and acceptance.   Dental advisory given  Plan Discussed with: CRNA, Anesthesiologist and Surgeon  Anesthesia Plan Comments:        Anesthesia Quick Evaluation

## 2013-07-16 NOTE — Progress Notes (Signed)
   Subjective:  Patient reports pain as moderate.  No events  Objective:   VITALS:   Filed Vitals:   07/16/13 1500 07/16/13 1515 07/16/13 1530 07/16/13 1545  BP: 147/89 147/72 138/76 142/72  Pulse: 84 78 71   Temp:    97.3 F (36.3 C)  TempSrc:      Resp: 23 25 15    SpO2: 99% 99% 97%     Neurologically intact Neurovascular intact Sensation intact distally Intact pulses distally Dorsiflexion/Plantar flexion intact Incision: dressing C/D/I and no drainage No cellulitis present Compartment soft   Lab Results  Component Value Date   WBC 9.2 07/08/2013   HGB 15.0 07/08/2013   HCT 42.2 07/08/2013   MCV 85.6 07/08/2013   PLT 169 07/08/2013     Assessment/Plan: Day of Surgery   Problem List Items Addressed This Visit   None      Advance diet Up with PT/OT in am Foley out in am DVT ppx - SCDs, ambulation, aspirin WBAT right lower extremity Pain control Discharge planning   Cheral Almas 07/16/2013, 5:07 PM [phone]

## 2013-07-16 NOTE — H&P (Signed)
PREOPERATIVE H&P  Chief Complaint: Right hip osteoarthritis  HPI: Brandy Scott is a 62 y.o. female who presents for scheduled total hip replacement on the right side.  Denies any changes to medical history.    Past Medical History  Diagnosis Date  . Hypercholesteremia   . HTN (hypertension)   . Diabetes mellitus   . Gout   . COPD (chronic obstructive pulmonary disease)   . SVT (supraventricular tachycardia)   . Anxiety   . Shortness of breath    Past Surgical History  Procedure Laterality Date  . Hernia repair    . Hysteroscopy    . Dilation and curettage of uterus    . Tubal ligation    . Cardiac catheterization      2010      clean   History   Social History  . Marital Status: Married    Spouse Name: N/A    Number of Children: 2  . Years of Education: N/A   Occupational History  . unemployed    Social History Main Topics  . Smoking status: Former Smoker -- 1.00 packs/day for 42 years    Types: Cigarettes    Quit date: 05/14/2010  . Smokeless tobacco: Never Used  . Alcohol Use: No  . Drug Use: No  . Sexual Activity: None   Other Topics Concern  . None   Social History Narrative  . None   Family History  Problem Relation Age of Onset  . Hypertension Father   . Diabetes Father   . Parkinsonism Father   . Colon cancer Mother   . Alzheimer's disease Mother   . Clotting disorder Mother     had filter placed   Allergies  Allergen Reactions  . Statins     Cause, muscle soreness and shortness of breath   Prior to Admission medications   Medication Sig Start Date End Date Taking? Authorizing Provider  aspirin 81 MG tablet Take 81 mg by mouth daily.     Yes Historical Provider, MD  fish oil-omega-3 fatty acids 1000 MG capsule Take 1 g by mouth daily.    Yes Historical Provider, MD  losartan-hydrochlorothiazide (HYZAAR) 100-25 MG per tablet Take 1 tablet by mouth daily.   Yes Historical Provider, MD  metFORMIN (GLUCOPHAGE) 1000 MG tablet Take 1,000  mg by mouth 2 (two) times daily with a meal.    Yes Historical Provider, MD  HYDROmorphone (DILAUDID) 2 MG tablet Take 2 mg by mouth every 4 (four) hours as needed for severe pain.    Historical Provider, MD     Positive ROS: All other systems have been reviewed and were otherwise negative with the exception of those mentioned in the HPI and as above.  Physical Exam: General: Alert, no acute distress Cardiovascular: No pedal edema Respiratory: No cyanosis, no use of accessory musculature GI: No organomegaly, abdomen is soft and non-tender Skin: No lesions in the area of chief complaint Neurologic: Sensation intact distally Psychiatric: Patient is competent for consent with normal mood and affect Lymphatic: No axillary or cervical lymphadenopathy  MUSCULOSKELETAL:   RLE - skin intact - no signs of infection or wounds - NVI  Assessment: Right hip osteoarthritis  Plan: Plan for Procedure(s): RIGHT TOTAL HIP ARTHROPLASTY ANTERIOR APPROACH  The risks benefits and alternatives were discussed with the patient including but not limited to the risks of nonoperative treatment, versus surgical intervention including infection, bleeding, nerve injury,  blood clots, cardiopulmonary complications, morbidity, mortality, among others, and they were  willing to proceed.   Cheral Almas, MD   07/16/2013 11:06 AM

## 2013-07-16 NOTE — Transfer of Care (Signed)
Immediate Anesthesia Transfer of Care Note  Patient: Brandy Scott  Procedure(s) Performed: Procedure(s): RIGHT TOTAL HIP ARTHROPLASTY ANTERIOR APPROACH (Right)  Patient Location: PACU  Anesthesia Type:General  Level of Consciousness: awake, alert , oriented and patient cooperative  Airway & Oxygen Therapy: Patient Spontanous Breathing and Patient connected to nasal cannula oxygen  Post-op Assessment: Report given to PACU RN, Post -op Vital signs reviewed and stable and Patient moving all extremities  Post vital signs: Reviewed and stable  Complications: No apparent anesthesia complications

## 2013-07-16 NOTE — Anesthesia Procedure Notes (Signed)
Procedure Name: Intubation Date/Time: 07/16/2013 11:33 AM Performed by: Jerilee Hoh Pre-anesthesia Checklist: Patient identified, Emergency Drugs available, Suction available and Patient being monitored Patient Re-evaluated:Patient Re-evaluated prior to inductionOxygen Delivery Method: Circle system utilized Preoxygenation: Pre-oxygenation with 100% oxygen Intubation Type: IV induction Ventilation: Mask ventilation without difficulty and Oral airway inserted - appropriate to patient size Laryngoscope Size: Mac and 3 Grade View: Grade II Tube type: Oral Tube size: 7.5 mm Number of attempts: 1 Airway Equipment and Method: Stylet Placement Confirmation: ETT inserted through vocal cords under direct vision,  positive ETCO2 and breath sounds checked- equal and bilateral Secured at: 23 cm Tube secured with: Tape Dental Injury: Teeth and Oropharynx as per pre-operative assessment

## 2013-07-16 NOTE — Op Note (Signed)
RIGHT TOTAL HIP ARTHROPLASTY ANTERIOR APPROACH  Procedure Note KINNEDY MONGIELLO   [id number]  Pre-op Diagnosis: Right hip osteoarthritis     Post-op Diagnosis: same   Operative Procedures  1. Total hip replacement; Right hip; uncemented [id number]   Personnel  Surgeon(s): Suvi Archuletta Glee Arvin, MD   Anesthesia: spinal, epidural   Prosthesis: Katrinka Blazing and Nephew  Acetabulum: R3 50 mm Femur: Anthology 6 standard Head: +4 size: 32 mm Bearing Type: Oxinium on poly  Date of Service: 07/16/2013  Total Hip Arthroplasty (Anterior Approach) Op Note:  After informed consent was obtained and the operative extremity marked in the holding area, the patient was brought back to the operating room and placed supine on the HANA table. Next, the operative extremity was prepped and draped in normal sterile fashion. Surgical timeout occurred verifying patient identification, surgical site, surgical procedure and administration of antibiotics.  A modified anterior Smith-Peterson approach to the hip was performed, using the interval between tensor fascia lata and sartorius.  Dissection was carried bluntly down onto the anterior hip capsule. The lateral femoral circumflex vessels were identified and coagulated. A capsulotomy was performed and the capsular flaps tagged for later repair.  Fluoroscopy was utilized to prepare for the femoral neck cut. The neck osteotomy was performed. The femoral head was removed, the acetabular rim was cleared of soft tissue and attention was turned to reaming the acetabulum.  Sequential reaming was performed under fluoroscopic guidance. We reamed to a size 50 mm, and then impacted the acetabular shell. One screw was placed through the acetabulum.  The liner was then placed after irrigation and attention turned to the femur.  After placing the femoral hook, the leg was taken to externally rotated, extended and adducted position taking care to perform soft tissue releases to allow for  adequate mobilization of the femur. Soft tissue was cleared from the shoulder of the greater trochanter and the hook elevator used to improve exposure of the proximal femur. Sequential broaching performed up to a size 6. Trial neck and head were placed. The leg was brought back up to neutral and the construct reduced. The position and sizing of components, offset and leg lengths were checked using fluoroscopy. Stability of the  construct was checked in extension and external rotation without any subluxation or impingement of prosthesis. We dislocated the prosthesis, dropped the leg back into position, removed trial components, and irrigated copiously. The final stem and head was then placed, the leg brought back up, the system reduced and fluoroscopy used to verify positioning.  We irrigated, obtained hemostasis and closed the capsule using #5 ethibond suture.  Dilute betadyne solution was used. The fascia was closed with #1 vicryl plus, the subcutaneous layers closed with 2.0 Vicryl Plus and the skin closed with 2.0 nylon and dermabond. A sterile dressing was applied. The patient was awakened in the operating room and taken to recovery in stable condition. All sponge, needle, and instrument counts were correct at the end of the case.   Position: supine  Complications: none.  Time Out: performed   Drains/Packing: none  Estimated blood loss: 200 cc  Returned to Recovery Room: in good condition.   Antibiotics: yes   Mechanical VTE (DVT) Prophylaxis: sequential compression devices, TED thigh-high  Chemical VTE (DVT) Prophylaxis: aspirin (other pharmacologic prophylaxis not  indicated - bleeding risk)   Fluid Replacement  Crystalloid: see anesthesia record Blood: none  FFP: none   Specimens Removed: 1 to pathology   Sponge and Instrument Count Correct?  yes   PACU: portable radiograph - low AP   Admission: inpatient status, start PT & OT POD#1  Plan/RTC: Return in 2 weeks for staple  removal. Return in 6 weeks to see MD.  Weight Bearing/Load Lower Extremity: full  Hip precautions: none Suture Removal: 14 days  Betadine to incision twice daily once dressing is removed on POD#7  N. Glee Arvin, MD [facility] [phone] 2:13 PM      Implant Name Type Inv. Item Serial No. Manufacturer Lot No. LRB No. Used  R3 XLPE 0degree, acetabular liner, 32mm ID/55mm OD    [employer] [id number] Right 1  30mm Reflection spherical head 6.12mm cancellous     [employer] [id number] Right 1  Reflection threaded central hole cover    [facility] [employer] [id number] Right 1  50mm OD, R3 three hole hemispherical shell    [employer] [id number] Right 1  Size 6 standard offset femoral component    [employer] [id number] Right 1  Oxinium 32mm OD +4, 12/14 taper femoral head       [employer] [id number] Right 1

## 2013-07-17 ENCOUNTER — Encounter (HOSPITAL_COMMUNITY): Payer: Self-pay | Admitting: Orthopaedic Surgery

## 2013-07-17 LAB — CBC
HCT: 35.1 % — ABNORMAL LOW (ref 36.0–46.0)
Hemoglobin: 11.9 g/dL — ABNORMAL LOW (ref 12.0–15.0)
MCH: 29.4 pg (ref 26.0–34.0)
MCHC: 33.9 g/dL (ref 30.0–36.0)
MCV: 86.7 fL (ref 78.0–100.0)
Platelets: 205 10*3/uL (ref 150–400)
RBC: 4.05 MIL/uL (ref 3.87–5.11)
RDW: 13 % (ref 11.5–15.5)
WBC: 12 10*3/uL — ABNORMAL HIGH (ref 4.0–10.5)

## 2013-07-17 LAB — BASIC METABOLIC PANEL
BUN: 14 mg/dL (ref 6–23)
CO2: 28 mEq/L (ref 19–32)
Calcium: 8.3 mg/dL — ABNORMAL LOW (ref 8.4–10.5)
Chloride: 100 mEq/L (ref 96–112)
Creatinine, Ser: 0.75 mg/dL (ref 0.50–1.10)
GFR calc Af Amer: >90 mL/min (ref >90)
GFR calc non Af Amer: 90 mL/min — ABNORMAL LOW (ref >90)
Glucose, Bld: 123 mg/dL — ABNORMAL HIGH (ref 70–99)
Potassium: 3.6 mEq/L (ref 3.5–5.1)
Sodium: 138 mEq/L (ref 135–145)

## 2013-07-17 LAB — URINE CULTURE
Colony Count: NO GROWTH
Culture: NO GROWTH

## 2013-07-17 LAB — GLUCOSE, CAPILLARY
Glucose-Capillary: 117 mg/dL — ABNORMAL HIGH (ref 70–99)
Glucose-Capillary: 146 mg/dL — ABNORMAL HIGH (ref 70–99)
Glucose-Capillary: 158 mg/dL — ABNORMAL HIGH (ref 70–99)
Glucose-Capillary: 151 mg/dL — ABNORMAL HIGH (ref 70–99)

## 2013-07-17 MED ORDER — OXYCODONE HCL 5 MG PO TABS: 5.0000 mg | ORAL_TABLET | ORAL | Status: DC | PRN

## 2013-07-17 MED ORDER — SENNOSIDES-DOCUSATE SODIUM 8.6-50 MG PO TABS: 1.0000 | ORAL_TABLET | Freq: Every evening | ORAL | Status: DC | PRN

## 2013-07-17 MED ORDER — ASPIRIN EC 325 MG PO TBEC: 325.0000 mg | DELAYED_RELEASE_TABLET | Freq: Two times a day (BID) | ORAL | Status: DC

## 2013-07-17 MED ORDER — PROMETHAZINE HCL 25 MG PO TABS: 25.0000 mg | ORAL_TABLET | Freq: Four times a day (QID) | ORAL | Status: DC | PRN

## 2013-07-17 MED ORDER — CALCIUM CARBONATE-VITAMIN D 500-200 MG-UNIT PO TABS: 1.0000 | ORAL_TABLET | Freq: Every day | ORAL | Status: DC

## 2013-07-17 MED ORDER — PREGABALIN 75 MG PO CAPS: 75.0000 mg | ORAL_CAPSULE | Freq: Two times a day (BID) | ORAL | Status: DC

## 2013-07-17 NOTE — Progress Notes (Addendum)
Subjective:  Patient reports pain as mild.  No events  Objective:   VITALS:   Filed Vitals:   07/16/13 1600 07/16/13 2100 07/17/13 0125 07/17/13 0526  BP: 147/68 130/60 107/50 105/52  Pulse: 86 75 88 84  Temp: 97.8 F (36.6 C) 97.8 F (36.6 C) 97.1 F (36.2 C) 97.8 F (36.6 C)  TempSrc: Oral     Resp: 16 16 16 18   SpO2: 97% 94% 95% 93%    Neurologically intact Neurovascular intact Sensation intact distally Intact pulses distally Dorsiflexion/Plantar flexion intact Incision: dressing C/D/I and no drainage No cellulitis present Compartment soft   Lab Results  Component Value Date   WBC 12.0* 07/17/2013   HGB 11.9* 07/17/2013   HCT 35.1* 07/17/2013   MCV 86.7 07/17/2013   PLT 205 07/17/2013     Assessment/Plan: 1 Day Post-Op   Problem List Items Addressed This Visit     Musculoskeletal and Integument   *Osteoarthritis of right hip - Primary   Relevant Medications      HYDROmorphone (DILAUDID) tablet 2 mg      aspirin EC tablet 325 mg      celecoxib (CELEBREX) capsule 200 mg      acetaminophen (TYLENOL) tablet 1,000 mg      OxyCODONE (OXYCONTIN) 12 hr tablet 10 mg      acetaminophen (TYLENOL) tablet 650 mg      acetaminophen (TYLENOL) suppository 650 mg      oxyCODONE (Oxy IR/ROXICODONE) immediate release tablet 5-10 mg      morphine 2 MG/ML injection 2 mg      ketorolac (TORADOL) 15 MG/ML injection 15 mg      methocarbamol (ROBAXIN) tablet 500 mg      ketorolac (TORADOL) 30 MG/ML injection (Completed)      HYDROmorphone (DILAUDID) 1 MG/ML injection (Completed)      HYDROmorphone (DILAUDID) 1 MG/ML injection (Completed)      oxyCODONE (Oxy IR/ROXICODONE) 5 MG immediate release tablet (Completed)      aspirin EC tablet      oxyCODONE (Oxy IR/ROXICODONE) immediate release tablet   Other Relevant Orders      Weight bearing as tolerated      Advance diet Up with PT/OT Foley d/c'ed DVT ppx - SCDs, ambulation, aspirin WBAT right lower extremity Ice  incisions ATC Pain control Discharge planning Rx in chart   Brandy Scott 07/17/2013, 7:57 AM [phone]

## 2013-07-17 NOTE — Evaluation (Signed)
Physical Therapy Evaluation Patient Details Name: Brandy Scott MRN: [id number] DOB: Apr 04, 1951 Today's Date: 07/17/2013 Time: 4098-1191 PT Time Calculation (min): 27 min  PT Assessment / Plan / Recommendation History of Present Illness  Pt is a 62 y/o female admitted s/p R THA direct anterior approach.  Clinical Impression  This patient presents with acute pain and decreased functional independence following the above mentioned procedure. At the time of PT eval, pt required min assist and min guard for functional mobility and ambulation. This patient is appropriate for skilled PT interventions to address functional limitations, improve safety and independence with functional mobility, and return to PLOF.     PT Assessment  Patient needs continued PT services    Follow Up Recommendations  Home health PT    Does the patient have the potential to tolerate intense rehabilitation      Barriers to Discharge        Equipment Recommendations  None recommended by PT    Recommendations for Other Services     Frequency 7X/week    Precautions / Restrictions Precautions Precautions: Fall Precaution Comments: Direct anterior approach Restrictions Weight Bearing Restrictions: Yes RLE Weight Bearing: Weight bearing as tolerated   Pertinent Vitals/Pain Pt reports minimal pain throughout session.       Mobility  Bed Mobility Bed Mobility: Supine to Sit;Sitting - Scoot to Edge of Bed Supine to Sit: 4: Min assist;HOB flat;With rails Sitting - Scoot to Edge of Bed: 4: Min guard;With rail Details for Bed Mobility Assistance: VC's for sequencing and technique. Transfers Transfers: Sit to Stand;Stand to Sit Sit to Stand: 4: Min guard;From bed;With upper extremity assist Stand to Sit: 4: Min guard;To chair/3-in-1;With upper extremity assist Details for Transfer Assistance: VC's for hand placement on seated surface Ambulation/Gait Ambulation/Gait Assistance: 4: Min guard Ambulation  Distance (Feet): 50 Feet Assistive device: Rolling walker Ambulation/Gait Assistance Details: VC's for sequencing and safety awareness with the RW. Cues for increased heel strike, improved posture and step-through gait pattern.  Gait Pattern: Step-through pattern;Step-to pattern;Decreased stride length;Trunk flexed Gait velocity: Decreased    Exercises Total Joint Exercises Ankle Circles/Pumps: 10 reps Quad Sets: 10 reps Hip ABduction/ADduction: 10 reps   PT Diagnosis: Difficulty walking;Acute pain  PT Problem List: Decreased strength;Decreased range of motion;Decreased activity tolerance;Decreased balance;Decreased mobility;Decreased knowledge of use of DME;Decreased safety awareness;Pain;Decreased knowledge of precautions PT Treatment Interventions: DME instruction;Gait training;Stair training;Functional mobility training;Therapeutic activities;Therapeutic exercise;Neuromuscular re-education;Patient/family education     PT Goals(Current goals can be found in the care plan section) Acute Rehab PT Goals Patient Stated Goal: Home tomorrow PT Goal Formulation: With patient/family Time For Goal Achievement: 07/24/13 Potential to Achieve Goals: Good  Visit Information  Last PT Received On: 07/17/13 Assistance Needed: +1 PT/OT/SLP Co-Evaluation/Treatment: Yes OT goals addressed during session: Proper use of Adaptive equipment and DME;ADL's and self-care History of Present Illness: Pt is a 62 y/o female admitted s/p R THA direct anterior approach.       Prior Functioning  Home Living Family/patient expects to be discharged to:: Private residence Living Arrangements: Spouse/significant other Available Help at Discharge: Family;Available 24 hours/day Type of Home: House Home Access: Stairs to enter [employer] of Steps: 3 Entrance Stairs-Rails: Right Home Layout: One level Home Equipment: Toilet riser;Walker - 2 wheels (Tub shower) Prior Function Level of Independence:  Independent Comments: Needs assist putting socks on Communication Communication: No difficulties Dominant Hand: Right    Cognition  Cognition Arousal/Alertness: Awake/alert Behavior During Therapy: WFL for tasks assessed/performed Overall Cognitive Status: Within  Functional Limits for tasks assessed    Extremity/Trunk Assessment Upper Extremity Assessment Upper Extremity Assessment: Defer to OT evaluation Lower Extremity Assessment Lower Extremity Assessment: RLE deficits/detail RLE Deficits / Details: Decreased strength and AROM consistent with R THA RLE: Unable to fully assess due to pain Cervical / Trunk Assessment Cervical / Trunk Assessment: Normal   Balance    End of Session PT - End of Session Equipment Utilized During Treatment: Gait belt Activity Tolerance: Patient tolerated treatment well Patient left: in chair;with call bell/phone within reach;with family/visitor present Nurse Communication: Mobility status  GP     Ruthann Cancer 07/17/2013, 2:35 PM  Ruthann Cancer, PT, DPT [phone]

## 2013-07-17 NOTE — Evaluation (Signed)
Occupational Therapy Evaluation Patient Details Name: Brandy Scott MRN: [id number] DOB: Feb 14, 1951 Today's Date: 07/17/2013 Time: 4098-1191 OT Time Calculation (min): 25 min  OT Assessment / Plan / Recommendation History of present illness Pt is a 62 y/o female admitted s/p    Clinical Impression   This 62 yo female admitted with above presents to acute OT with decreased AROM RLE thus affecting pt's PLOF and safety with BADLs. Will benefit from acute OT without need for follow up.    OT Assessment  Patient needs continued OT Services    Follow Up Recommendations  No OT follow up       Equipment Recommendations   (tub DME TBD 07/18/13)       Frequency  Min 2X/week    Precautions / Restrictions Precautions Precautions: None Restrictions RLE Weight Bearing: Weight bearing as tolerated       ADL  Eating/Feeding: Independent Where Assessed - Eating/Feeding: Chair Grooming: Supervision/safety;Wash/dry hands Where Assessed - Grooming: Supported standing Upper Body Bathing: Set up Where Assessed - Upper Body Bathing: Unsupported sitting Lower Body Bathing: Moderate assistance Where Assessed - Lower Body Bathing: Supported sit to stand Upper Body Dressing: Set up Where Assessed - Upper Body Dressing: Unsupported sitting Lower Body Dressing: Moderate assistance Where Assessed - Lower Body Dressing: Unsupported sit to stand Toilet Transfer: Min Pension scheme manager Method: Sit to Barista: Raised toilet seat with arms (or 3-in-1 over toilet) Toileting - Clothing Manipulation and Hygiene: Min guard Where Assessed - Toileting Clothing Manipulation and Hygiene: Sit to stand from 3-in-1 or toilet Equipment Used: Gait belt;Rolling walker Transfers/Ambulation Related to ADLs: Min guard A for all with RW    OT Diagnosis: Generalized weakness  OT Problem List: Decreased strength;Decreased range of motion;Impaired balance (sitting and/or  standing);Decreased knowledge of use of DME or AE OT Treatment Interventions: Self-care/ADL training;Patient/family education;Balance training;DME and/or AE instruction   OT Goals(Current goals can be found in the care plan section) Acute Rehab OT Goals Patient Stated Goal: home tomorrow OT Goal Formulation: With patient Time For Goal Achievement: 07/24/13 Potential to Achieve Goals: Good  Visit Information  Last OT Received On: 07/17/13 Assistance Needed: +1 PT/OT/SLP Co-Evaluation/Treatment: Yes PT goals addressed during session: Mobility/safety with mobility;Balance;Proper use of DME;Strengthening/ROM OT goals addressed during session: Proper use of Adaptive equipment and DME;ADL's and self-care History of Present Illness: Pt is a 62 y/o female admitted s/p        Prior Functioning     Home Living Family/patient expects to be discharged to:: Private residence Living Arrangements: Spouse/significant other Available Help at Discharge: Family;Available 24 hours/day Type of Home: House Home Access: Stairs to enter [employer] of Steps: 3 Entrance Stairs-Rails: Right Home Layout: One level Home Equipment: Toilet riser;Walker - 2 wheels (Tub shower) Prior Function Level of Independence: Independent Comments: Needs assist putting socks on Communication Communication: No difficulties Dominant Hand: Right         Vision/Perception Vision - History Patient Visual Report: No change from baseline   Cognition  Cognition Arousal/Alertness: Awake/alert Behavior During Therapy: WFL for tasks assessed/performed Overall Cognitive Status: Within Functional Limits for tasks assessed    Extremity/Trunk Assessment Upper Extremity Assessment Upper Extremity Assessment: Overall WFL for tasks assessed     Mobility Bed Mobility Bed Mobility: Supine to Sit;Sitting - Scoot to Edge of Bed Supine to Sit: 4: Min assist;HOB flat;With rails Sitting - Scoot to Edge of Bed: 4:  Min guard;With rail Details for Bed Mobility Assistance: VCs for  sequencing Transfers Transfers: Sit to Stand;Stand to Sit Sit to Stand: 4: Min guard;With upper extremity assist;With armrests;From elevated surface;From bed Stand to Sit: 4: Min guard;With upper extremity assist;With armrests;To chair/3-in-1 Details for Transfer Assistance: VCs for safe hand placement           End of Session OT - End of Session Equipment Utilized During Treatment: Gait belt;Rolling walker Activity Tolerance: Patient tolerated treatment well Patient left: in chair;with call bell/phone within reach;with family/visitor present Nurse Communication: Mobility status       Evette Georges [phone] 07/17/2013, 1:11 PM

## 2013-07-17 NOTE — Progress Notes (Signed)
Physical Therapy Treatment Patient Details Name: Brandy Scott MRN: [id number] DOB: August 11, 1951 Today's Date: 07/17/2013 Time: 8469-6295 PT Time Calculation (min): 33 min  PT Assessment / Plan / Recommendation  History of Present Illness Pt is a 62 y/o female admitted s/p R THA direct anterior approach.   PT Comments   Pt progressing well towards physical therapy goals. She is able to tolerate therapeutic exercise with little increase in pain, and is ambulating with a step-through gait pattern with use of UE's on the walker. Ready to practice steps tomorrow morning, and pt is anticipating d/c tomorrow afternoon.   Follow Up Recommendations  Home health PT     Does the patient have the potential to tolerate intense rehabilitation     Barriers to Discharge        Equipment Recommendations  None recommended by PT    Recommendations for Other Services    Frequency 7X/week   Progress towards PT Goals Progress towards PT goals: Progressing toward goals  Plan Current plan remains appropriate    Precautions / Restrictions Precautions Precautions: Fall Precaution Comments: Direct anterior approach Restrictions Weight Bearing Restrictions: Yes RLE Weight Bearing: Weight bearing as tolerated   Pertinent Vitals/Pain 5/10 after ambulation.     Mobility  Bed Mobility Bed Mobility: Sit to Supine;Scooting to HOB Supine to Sit: 4: Min assist;HOB flat;With rails Sitting - Scoot to Edge of Bed: 4: Min guard;With rail Sit to Supine: 5: Supervision;HOB flat;With rail Scooting to [facility]: 5: Supervision Details for Bed Mobility Assistance: VC's for sequencing and technique Transfers Transfers: Sit to Stand;Stand to Sit Sit to Stand: 4: Min guard;From chair/3-in-1;With upper extremity assist Stand to Sit: 4: Min guard;To bed;With upper extremity assist Details for Transfer Assistance: Pt demonstrated proper hand placement and safety awareness. Ambulation/Gait Ambulation/Gait Assistance: 4:  Min guard Ambulation Distance (Feet): 75 Feet Assistive device: Rolling walker Ambulation/Gait Assistance Details: VC's for sequencing and fluidity of walker movement. Gait Pattern: Step-through pattern;Decreased stride length;Narrow base of support Gait velocity: Decreased    Exercises Total Joint Exercises Ankle Circles/Pumps: 10 reps Quad Sets: 10 reps Towel Squeeze: 10 reps Heel Slides: 10 reps Hip ABduction/ADduction: 10 reps Straight Leg Raises: 10 reps Long Arc Quad: 10 reps   PT Diagnosis: Difficulty walking;Acute pain  PT Problem List: Decreased strength;Decreased range of motion;Decreased activity tolerance;Decreased balance;Decreased mobility;Decreased knowledge of use of DME;Decreased safety awareness;Pain;Decreased knowledge of precautions PT Treatment Interventions: DME instruction;Gait training;Stair training;Functional mobility training;Therapeutic activities;Therapeutic exercise;Neuromuscular re-education;Patient/family education   PT Goals (current goals can now be found in the care plan section) Acute Rehab PT Goals Patient Stated Goal: Home tomorrow PT Goal Formulation: With patient/family Time For Goal Achievement: 07/24/13 Potential to Achieve Goals: Good  Visit Information  Last PT Received On: 07/17/13 Assistance Needed: +1 PT/OT/SLP Co-Evaluation/Treatment: Yes OT goals addressed during session: Proper use of Adaptive equipment and DME;ADL's and self-care History of Present Illness: Pt is a 62 y/o female admitted s/p R THA direct anterior approach.    Subjective Data  Subjective: "It's a little sore from earlier but it's not pain." Patient Stated Goal: Home tomorrow   Cognition  Cognition Arousal/Alertness: Awake/alert Behavior During Therapy: WFL for tasks assessed/performed Overall Cognitive Status: Within Functional Limits for tasks assessed    Balance     End of Session PT - End of Session Equipment Utilized During Treatment: Gait  belt Activity Tolerance: Patient tolerated treatment well Patient left: in bed;with call bell/phone within reach;with family/visitor present Nurse Communication: Mobility status   GP  Ruthann Cancer 07/17/2013, 3:46 PM  Ruthann Cancer, PT, DPT [phone]

## 2013-07-17 NOTE — Care Management Note (Signed)
Case Manager spoke with patient concerning home health and equipment needs at discharge. Choice offered for home health. Patient states she has rolling walker and a elevated commode seat with handles. Vance Peper, RN BSN

## 2013-07-18 ENCOUNTER — Encounter (HOSPITAL_COMMUNITY): Payer: Self-pay | Admitting: Family

## 2013-07-18 LAB — GLUCOSE, CAPILLARY: Glucose-Capillary: 128 mg/dL — ABNORMAL HIGH (ref 70–99)

## 2013-07-18 LAB — URINE CULTURE: Colony Count: 9000

## 2013-07-18 LAB — CBC
HCT: 33.3 % — ABNORMAL LOW (ref 36.0–46.0)
Hemoglobin: 11.2 g/dL — ABNORMAL LOW (ref 12.0–15.0)
MCH: 29.6 pg (ref 26.0–34.0)
MCHC: 33.6 g/dL (ref 30.0–36.0)
MCV: 87.9 fL (ref 78.0–100.0)
Platelets: 183 10*3/uL (ref 150–400)
RBC: 3.79 MIL/uL — ABNORMAL LOW (ref 3.87–5.11)
RDW: 13.2 % (ref 11.5–15.5)
WBC: 10.8 10*3/uL — ABNORMAL HIGH (ref 4.0–10.5)

## 2013-07-18 NOTE — Progress Notes (Signed)
Physical Therapy Treatment Patient Details Name: YANIS JUMA MRN: [id number] DOB: Mar 07, 1951 Today's Date: 07/18/2013 Time: 4098-1191 PT Time Calculation (min): 25 min  PT Assessment / Plan / Recommendation  History of Present Illness Pt is a 62 y/o female admitted s/p R THA direct anterior approach.   PT Comments   Patient making great progress with therapy and able to tolerate increased ambulation and stair training this AM. From PT standpoint patient is able to DC home. Will follow up as needed.   Follow Up Recommendations  Home health PT     Does the patient have the potential to tolerate intense rehabilitation     Barriers to Discharge        Equipment Recommendations  None recommended by PT    Recommendations for Other Services    Frequency 7X/week   Progress towards PT Goals Progress towards PT goals: Progressing toward goals  Plan Current plan remains appropriate    Precautions / Restrictions Precautions Precautions: Fall Precaution Comments: Direct anterior approach Restrictions RLE Weight Bearing: Weight bearing as tolerated   Pertinent Vitals/Pain no apparent distress     Mobility  Bed Mobility Supine to Sit: 6: Modified independent (Device/Increase time) Transfers Sit to Stand: 5: Supervision Stand to Sit: 5: Supervision Ambulation/Gait Ambulation/Gait Assistance: 5: Supervision Ambulation Distance (Feet): 300 Feet Assistive device: Rolling walker Ambulation/Gait Assistance Details: Patient continues with step through gait Gait Pattern: Step-through pattern;Decreased stride length Gait velocity: WFL Stairs: Yes Stairs Assistance: 4: Min guard Stair Management Technique: Step to pattern;Forwards;One rail Right;One rail Left Number of Stairs: 3    Exercises Total Joint Exercises Quad Sets: 10 reps;Right;AROM Heel Slides: 10 reps;Right;AROM Hip ABduction/ADduction: 10 reps;Right;AROM Long Arc Quad: 10 reps;Right;AROM   PT Diagnosis:    PT  Problem List:   PT Treatment Interventions:     PT Goals (current goals can now be found in the care plan section)    Visit Information  Last PT Received On: 07/18/13 Assistance Needed: +1 History of Present Illness: Pt is a 62 y/o female admitted s/p R THA direct anterior approach.    Subjective Data      Cognition  Cognition Arousal/Alertness: Awake/alert Behavior During Therapy: WFL for tasks assessed/performed Overall Cognitive Status: Within Functional Limits for tasks assessed    Balance     End of Session PT - End of Session Equipment Utilized During Treatment: Gait belt Activity Tolerance: Patient tolerated treatment well Patient left: with call bell/phone within reach;with family/visitor present;in chair Nurse Communication: Mobility status   GP     Fredrich Birks 07/18/2013, 8:19 AM  07/18/2013 Fredrich Birks PTA [phone] pager [phone] office

## 2013-07-18 NOTE — Plan of Care (Signed)
Problem: Consults Goal: Diagnosis- Total Joint Replacement Primary Total Hip Right Anterior     

## 2013-07-18 NOTE — Progress Notes (Signed)
Discharge instructions and prescriptions given and explained to patient; denies questions or concerns. IV removed. Patient discharged via wheelchair accompanied by RN and family with belongings, prescriptions, and discharge packet.

## 2013-07-18 NOTE — Progress Notes (Signed)
   CARE MANAGEMENT NOTE 07/18/2013  Patient:  Brandy Scott, Brandy Scott   Account Number:  [id number]  Date Initiated:  07/17/2013  Documentation initiated by:  Vance Peper  Subjective/Objective Assessment:   62 yr old female s/p right total hip arthroplasty.     Action/Plan:   Case Manager spoke with patient concerning home health and DME needs at discharge. Choice offered. Patient states she has rolling walker and elevated seat with handles for toilet.   Anticipated DC Date:  07/18/2013   Anticipated DC Plan:  HOME W HOME HEALTH SERVICES      DC Planning Services  CM consult      [facility] Choice  HOME HEALTH   Choice offered to / List presented to:  C-1 Patient   DME arranged  NA      DME agency  NA     HH arranged  HH-3 OT  HH-2 PT      [facility] agency  [facility]   Status of service:  Completed, signed off Medicare Important Message given?   (If response is "NO", the following Medicare IM given date fields will be blank) Date Medicare IM given:   Date Additional Medicare IM given:    Discharge Disposition:  HOME W HOME HEALTH SERVICES  Per UR Regulation:    If discussed at Long Length of Stay Meetings, dates discussed:    Comments:  07/18/13 13:00 CM spoke to pt as she was unsure whether [facility] or [facility] would be providing HH.  CM spoke with [place] who states she is set up with them to receive service at home tomorrow. Contact numbers and address were verified.  No other CM needs were communicated.  Freddy Jaksch, BSN, CM [phone].

## 2013-07-18 NOTE — Discharge Summary (Signed)
  Final diagnosis osteoarthritis hip. Surgical procedure total hip arthroplasty.  Plan for discharge to home after physical therapy today. Prescriptions are on the chart. Followup with Dr. Roda Shutters in 2 weeks.

## 2013-07-18 NOTE — Progress Notes (Signed)
Occupational Therapy Treatment Patient Details Name: Brandy Scott MRN: [id number] DOB: [birth year] Today's Date: 07/18/2013 Time: 4098-1191 OT Time Calculation (min): 31 min  OT Assessment / Plan / Recommendation  History of present illness Pt is a 62 y/o female admitted s/p R THA direct anterior approach.   OT comments  Pt is supposed to d/c today. Pt is doing well and has assist for ADL PRN. Feel she is ok to d/c from OT standpoint.  Follow Up Recommendations  No OT follow up;Supervision/Assistance - 24 hour    Barriers to Discharge       Equipment Recommendations  None recommended by OT    Recommendations for Other Services    Frequency Min 2X/week   Progress towards OT Goals Progress towards OT goals: Progressing toward goals  Plan Discharge plan remains appropriate    Precautions / Restrictions Precautions Precautions: Fall Precaution Comments: Direct anterior approach Restrictions Weight Bearing Restrictions: No RLE Weight Bearing: Weight bearing as tolerated   Pertinent Vitals/Pain Pt states "only sore" R hip; rest    ADL  Tub/Shower Transfer: Min guard (hold to wall like shower wall) Equipment Used: Rolling walker ADL Comments: Discussed at length the tub DME options. Pt stepped over a trashcan similar to a bath tub holding to wall with min guard assist and did very well with this. She states she doesnt feel she will need tub DME. Discussed safety with having husband present with first several showers for safety and showering when she has energy/timing of the day, using lukewarm water, etc. Feel pt will do ok without a tubbench or seat also but discussed where to obtain a seat if she changes her mind. Issued DME handout and explained DME options from handout also. Pt states husband will assist with LB self care and she declines needing AE at this time. Discussed sequence of LB dressing.     OT Diagnosis:    OT Problem List:   OT Treatment Interventions:     OT  Goals(current goals can now be found in the care plan section)    Visit Information  Last OT Received On: 07/18/13 Assistance Needed: +1 History of Present Illness: Pt is a 62 y/o female admitted s/p R THA direct anterior approach.    Subjective Data      Prior Functioning       Cognition  Cognition Arousal/Alertness: Awake/alert Behavior During Therapy: WFL for tasks assessed/performed Overall Cognitive Status: Within Functional Limits for tasks assessed    Mobility   Transfers Transfers: Sit to Stand;Stand to Sit Sit to Stand: 5: Supervision;With upper extremity assist;From chair/3-in-1 Stand to Sit: 5: Supervision;With upper extremity assist;To chair/3-in-1 Details for Transfer Assistance: Pt demonstrated proper hand placement and safety awareness.       Balance     End of Session OT - End of Session Equipment Utilized During Treatment: Rolling walker Activity Tolerance: Patient tolerated treatment well Patient left: in chair;with call bell/phone within reach  GO     Lennox Laity [phone] 07/18/2013, 10:12 AM

## 2013-07-20 LAB — VITAMIN D 1,25 DIHYDROXY
Vitamin D 1, 25 (OH)2 Total: 22 pg/mL (ref 18–72)
Vitamin D2 1, 25 (OH)2: <8 pg/mL
Vitamin D3 1, 25 (OH)2: 22 pg/mL

## 2013-11-12 ENCOUNTER — Ambulatory Visit: Payer: Medicare Other | Admitting: Critical Care Medicine

## 2013-11-12 ENCOUNTER — Ambulatory Visit (INDEPENDENT_AMBULATORY_CARE_PROVIDER_SITE_OTHER): Payer: Medicare Other | Admitting: Adult Health

## 2013-11-12 ENCOUNTER — Encounter: Payer: Self-pay | Admitting: Adult Health

## 2013-11-12 ENCOUNTER — Ambulatory Visit: Payer: Medicare Other | Admitting: Adult Health

## 2013-11-12 VITALS — BP 122/74 | HR 79 | Temp 98.4°F | Ht 67.0 in | Wt 235.0 lb

## 2013-11-12 DIAGNOSIS — J449 Chronic obstructive pulmonary disease, unspecified: Secondary | ICD-10-CM

## 2013-11-12 MED ORDER — FLUTICASONE FUROATE-VILANTEROL 100-25 MCG/INH IN AEPB: 1.0000 | INHALATION_SPRAY | Freq: Every day | RESPIRATORY_TRACT | Status: DC

## 2013-11-12 NOTE — Progress Notes (Signed)
   Subjective:    Patient ID: Brandy Scott, female    DOB: 1950-10-18, 63 y.o.   MRN: [id number]  HPI 63 yo WF former smoker with COPD -Gold B   11/12/2013 Follow up COPD  Returns for 6 month follow up . Says she is doing some better , got hip replacement in Dec, which has helped with her pain. Says she did well in PT . Admits that she is still sedentary . Does get winded with walking for long distances.  Now off all inhalers. She denies chest pain, orthopnea or edema  We discussed advancing activity as tolerated  to help w/ deconditioning and wt issues .  No desats with walk in office today .  CXR 06/2613 w/ no acute issues noted.    Review of Systems Constitutional:   No  weight loss, night sweats,  Fevers, chills, + fatigue, or  lassitude.  HEENT:   No headaches,  Difficulty swallowing,  Tooth/dental problems, or  Sore throat,                No sneezing, itching, ear ache, nasal congestion, post nasal drip,   CV:  No chest pain,  Orthopnea, PND, swelling in lower extremities, anasarca, dizziness, palpitations, syncope.   GI  No heartburn, indigestion, abdominal pain, nausea, vomiting, diarrhea, change in bowel habits, loss of appetite, bloody stools.   Resp:  No excess mucus, no productive cough,  No non-productive cough,  No coughing up of blood.  No change in color of mucus.  No wheezing.  No chest wall deformity  Skin: no rash or lesions.  GU: no dysuria, change in color of urine, no urgency or frequency.  No flank pain, no hematuria   MS:  No joint pain or swelling.  No decreased range of motion.  No back pain.  Psych:  No change in mood or affect. No depression or anxiety.  No memory loss.         Objective:   Physical Exam GEN: A/Ox3; pleasant , NAD, obese   HEENT:  [place]/AT,  EACs-clear, TMs-wnl, NOSE-clear, THROAT-clear, no lesions, no postnasal drip or exudate noted.   NECK:  Supple w/ fair ROM; no JVD; normal carotid impulses w/o bruits; no thyromegaly or  nodules palpated; no lymphadenopathy.  RESP  Clear  P & A; w/o, wheezes/ rales/ or rhonchi.no accessory muscle use, no dullness to percussion  CARD:  RRR, no m/r/g  , no peripheral edema, pulses intact, no cyanosis or clubbing.  GI:   Soft & nt; nml bowel sounds; no organomegaly or masses detected.  Musco: Warm bil, no deformities or joint swelling noted.   Neuro: alert, no focal deficits noted.    Skin: Warm, no lesions or rashes         Assessment & Plan:

## 2013-11-12 NOTE — Assessment & Plan Note (Signed)
Suspect deconditioning major contributing factor of pt dyspnea  Will try BREO to see if helps her breathing .  Advised to watch for tachycardia with LABA .   Plan  Begin BREO inhaler 1 puff daily  Call back if develop palpitations or fast heart beat .  Advance activity as tolerated.  follow up Dr. Delford FieldWright  In 2 months and As needed   Please contact office for sooner follow up if symptoms do not improve or worsen or seek emergency care

## 2013-11-12 NOTE — Patient Instructions (Signed)
Begin BREO inhaler 1 puff daily  Call back if develop palpitations or fast heart beat .  Advance activity as tolerated.  follow up Dr. Delford FieldWright  In 2 months and As needed   Please contact office for sooner follow up if symptoms do not improve or worsen or seek emergency care

## 2014-10-14 ENCOUNTER — Ambulatory Visit: Payer: Self-pay | Admitting: Critical Care Medicine

## 2014-10-28 ENCOUNTER — Ambulatory Visit: Payer: Self-pay | Admitting: Critical Care Medicine

## 2014-11-11 ENCOUNTER — Encounter: Payer: Self-pay | Admitting: Critical Care Medicine

## 2014-11-11 ENCOUNTER — Ambulatory Visit (INDEPENDENT_AMBULATORY_CARE_PROVIDER_SITE_OTHER): Payer: Medicare Other | Admitting: Critical Care Medicine

## 2014-11-11 VITALS — BP 130/73 | HR 94 | Temp 97.8°F | Ht 67.0 in | Wt 243.0 lb

## 2014-11-11 DIAGNOSIS — J449 Chronic obstructive pulmonary disease, unspecified: Secondary | ICD-10-CM

## 2014-11-11 DIAGNOSIS — J4551 Severe persistent asthma with (acute) exacerbation: Secondary | ICD-10-CM

## 2014-11-11 MED ORDER — PREDNISONE 10 MG PO TABS: ORAL_TABLET | ORAL | Status: DC

## 2014-11-11 MED ORDER — ALBUTEROL SULFATE HFA 108 (90 BASE) MCG/ACT IN AERS: 2.0000 | INHALATION_SPRAY | Freq: Four times a day (QID) | RESPIRATORY_TRACT | Status: DC | PRN

## 2014-11-11 MED ORDER — MOMETASONE FURO-FORMOTEROL FUM 200-5 MCG/ACT IN AERO: 2.0000 | INHALATION_SPRAY | Freq: Two times a day (BID) | RESPIRATORY_TRACT | Status: DC

## 2014-11-11 MED ORDER — FLUTTER DEVI: Status: DC

## 2014-11-11 NOTE — Patient Instructions (Signed)
Use a flutter valve 3-4 times daily Start Dulera 200 two puff twice daily Prednisone 10mg  Take 4 for three days 3 for three days 2 for three days 1 for three days and stop Proventil inhaler as needed [facility]

## 2014-11-11 NOTE — Progress Notes (Signed)
Subjective:    Patient ID: Brandy Scott, female    DOB: Nov 14, 1950, 64 y.o.   MRN: [id number]  HPI 64 y.o.  WF former smoker with COPD -Gold B    11/11/2014 Chief Complaint  Patient presents with  . Follow-up    COPD: cough will not go away.  took a round of augmentin, went away, now cough is back.  Phlegm will not come out of chest.  Breathing is not as good as it should be.  Pt had hip issues and had surgery.  Now notes more active and more dyspnea with exertion.  Pt also had bronchitis flare .  Pt went to PCP.  Rx Zpak no effect, then augmentin and helped : 06/2014.  Then back again 09/2013.  Rx augmentin. Pt had cough and hard to raise.  Used mucinex and did not help. Pt has mucus but will not raise.    No smoking .   Pt has hard time with inhalers.  ?? Not using .  Breo did not work avoid DPIs.    Review of Systems Constitutional:   No  weight loss, night sweats,  Fevers, chills, + fatigue, or  lassitude.  HEENT:   No headaches,  Difficulty swallowing,  Tooth/dental problems, or  Sore throat,                No sneezing, itching, ear ache, nasal congestion, post nasal drip,   CV:  No chest pain,  Orthopnea, PND, swelling in lower extremities, anasarca, dizziness, palpitations, syncope.   GI  No heartburn, indigestion, abdominal pain, nausea, vomiting, diarrhea, change in bowel habits, loss of appetite, bloody stools.   Resp:  Notes  excess mucus, notes  productive cough,  Notes  non-productive cough,  No coughing up of blood.  Notes change in color of mucus.  Notes wheezing.  No chest wall deformity  Skin: no rash or lesions.  GU: no dysuria, change in color of urine, no urgency or frequency.  No flank pain, no hematuria   MS:  No joint pain or swelling.  No decreased range of motion.  No back pain.  Psych:  No change in mood or affect. No depression or anxiety.  No memory loss.         Objective:   Physical Exam BP 130/73 mmHg  Pulse 94  Temp(Src) 97.8 F (36.6  C) (Oral)  Ht 5\' 7"  (1.702 m)  Wt 243 lb (110.224 kg)  BMI 38.05 kg/m2  SpO2 96%  GEN: A/Ox3; pleasant , NAD, obese   HEENT:  [place]/AT,  EACs-clear, TMs-wnl, NOSE-clear, THROAT-clear, no lesions, no postnasal drip or exudate noted.   NECK:  Supple w/ fair ROM; no JVD; normal carotid impulses w/o bruits; no thyromegaly or nodules palpated; no lymphadenopathy.  RESP  Exp wheezes, poor airflow.no accessory muscle use, no dullness to percussion  CARD:  RRR, no m/r/g  , no peripheral edema, pulses intact, no cyanosis or clubbing.  GI:   Soft & nt; nml bowel sounds; no organomegaly or masses detected.  Musco: Warm bil, no deformities or joint swelling noted.   Neuro: alert, no focal deficits noted.    Skin: Warm, no lesions or rashes    amb sats 94% on RA with exertion      Assessment & Plan:   COPD, moderate, Gold B Copd gold B with acute flare Plan Use a flutter valve 3-4 times daily Start Dulera 200 two puff twice daily Prednisone 10mg  Take 4 for  three days 3 for three days 2 for three days 1 for three days and stop Proventil inhaler as needed [facility] at [facility] will be ordered Return 2 months at [facility] office     Updated Medication List Outpatient Encounter Prescriptions as of 11/11/2014  Medication Sig  . aspirin 81 MG tablet Take 81 mg by mouth daily.    . fenofibrate (TRICOR) 145 MG tablet   . losartan-hydrochlorothiazide (HYZAAR) 100-25 MG per tablet Take 1 tablet by mouth daily.  . metFORMIN (GLUCOPHAGE) 1000 MG tablet Take 1,000 mg by mouth 2 (two) times daily with a meal.   . albuterol (PROVENTIL HFA;VENTOLIN HFA) 108 (90 BASE) MCG/ACT inhaler Inhale 2 puffs into the lungs every 6 (six) hours as needed for wheezing or shortness of breath.  . mometasone-formoterol (DULERA) 200-5 MCG/ACT AERO Inhale 2 puffs into the lungs 2 (two) times daily.  . predniSONE (DELTASONE) 10 MG tablet Take 4 for three days 3 for three days 2 for three days 1 for three days  and stop  . Respiratory Therapy Supplies (FLUTTER) DEVI Use 3-4 times daily  . [DISCONTINUED] aspirin EC 325 MG tablet Take 1 tablet (325 mg total) by mouth 2 (two) times daily. (Patient not taking: Reported on 11/11/2014)  . [DISCONTINUED] calcium-vitamin D (OSCAL WITH D) 500-200 MG-UNIT per tablet Take 1 tablet by mouth daily with breakfast. (Patient not taking: Reported on 11/11/2014)  . [DISCONTINUED] fish oil-omega-3 fatty acids 1000 MG capsule Take 1 g by mouth daily.   . [DISCONTINUED] Fluticasone Furoate-Vilanterol (BREO ELLIPTA) 100-25 MCG/INH AEPB Inhale 1 puff into the lungs daily. (Patient not taking: Reported on 11/11/2014)  . [DISCONTINUED] HYDROmorphone (DILAUDID) 2 MG tablet Take 2 mg by mouth every 4 (four) hours as needed for severe pain.  . [DISCONTINUED] oxyCODONE (OXY IR/ROXICODONE) 5 MG immediate release tablet Take 1-3 tablets (5-15 mg total) by mouth every 4 (four) hours as needed. (Patient not taking: Reported on 11/11/2014)  . [DISCONTINUED] pregabalin (LYRICA) 75 MG capsule Take 1 capsule (75 mg total) by mouth 2 (two) times daily. (Patient not taking: Reported on 11/11/2014)  . [DISCONTINUED] promethazine (PHENERGAN) 25 MG tablet Take 1 tablet (25 mg total) by mouth every 6 (six) hours as needed for nausea. (Patient not taking: Reported on 11/11/2014)  . [DISCONTINUED] senna-docusate (SENOKOT S) 8.6-50 MG per tablet Take 1 tablet by mouth at bedtime as needed. (Patient not taking: Reported on 11/11/2014)

## 2014-11-12 NOTE — Assessment & Plan Note (Signed)
Copd gold B with acute flare Plan Use a flutter valve 3-4 times daily Start Dulera 200 two puff twice daily Prednisone 10mg  Take 4 for three days 3 for three days 2 for three days 1 for three days and stop Proventil inhaler as needed [facility]

## 2014-12-20 ENCOUNTER — Other Ambulatory Visit (HOSPITAL_COMMUNITY): Payer: Self-pay | Admitting: Orthopaedic Surgery

## 2014-12-21 ENCOUNTER — Encounter (HOSPITAL_COMMUNITY)
Admission: RE | Admit: 2014-12-21 | Discharge: 2014-12-21 | Disposition: A | Discharge status: Home / Self Care | Payer: Medicare Other | Source: Ambulatory Visit | Attending: Orthopaedic Surgery | Admitting: Orthopaedic Surgery

## 2014-12-21 ENCOUNTER — Other Ambulatory Visit: Payer: Self-pay

## 2014-12-21 ENCOUNTER — Encounter (HOSPITAL_COMMUNITY): Payer: Self-pay

## 2014-12-21 DIAGNOSIS — Z01812 Encounter for preprocedural laboratory examination: Secondary | ICD-10-CM | POA: Diagnosis present

## 2014-12-21 DIAGNOSIS — M1612 Unilateral primary osteoarthritis, left hip: Secondary | ICD-10-CM | POA: Insufficient documentation

## 2014-12-21 HISTORY — DX: Unspecified osteoarthritis, unspecified site: M19.90

## 2014-12-21 HISTORY — DX: Unspecified asthma, uncomplicated: J45.909

## 2014-12-21 LAB — COMPREHENSIVE METABOLIC PANEL
ALT: 23 U/L (ref 14–54)
AST: 25 U/L (ref 15–41)
Albumin: 4 g/dL (ref 3.5–5.0)
Alkaline Phosphatase: 57 U/L (ref 38–126)
Anion gap: 14 (ref 5–15)
BUN: 16 mg/dL (ref 6–20)
CO2: 24 mmol/L (ref 22–32)
Calcium: 10 mg/dL (ref 8.9–10.3)
Chloride: 100 mmol/L — ABNORMAL LOW (ref 101–111)
Creatinine, Ser: 0.98 mg/dL (ref 0.44–1.00)
GFR calc Af Amer: >60 mL/min (ref >60)
GFR calc non Af Amer: >60 mL/min (ref >60)
Glucose, Bld: 170 mg/dL — ABNORMAL HIGH (ref 70–99)
Potassium: 3.6 mmol/L (ref 3.5–5.1)
Sodium: 138 mmol/L (ref 135–145)
Total Bilirubin: 0.6 mg/dL (ref 0.3–1.2)
Total Protein: 7.3 g/dL (ref 6.5–8.1)

## 2014-12-21 LAB — URINALYSIS, ROUTINE W REFLEX MICROSCOPIC
Bilirubin Urine: NEGATIVE
Glucose, UA: NEGATIVE mg/dL
Hgb urine dipstick: NEGATIVE
Ketones, ur: NEGATIVE mg/dL
Leukocytes, UA: NEGATIVE
Nitrite: NEGATIVE
Protein, ur: NEGATIVE mg/dL
Specific Gravity, Urine: 1.018 (ref 1.005–1.030)
Urobilinogen, UA: 0.2 mg/dL (ref 0.0–1.0)
pH: 6.5 (ref 5.0–8.0)

## 2014-12-21 LAB — CBC WITH DIFFERENTIAL/PLATELET
Basophils Absolute: 0.1 10*3/uL (ref 0.0–0.1)
Basophils Relative: 1 % (ref 0–1)
Eosinophils Absolute: 0.2 10*3/uL (ref 0.0–0.7)
Eosinophils Relative: 2 % (ref 0–5)
HCT: 40.8 % (ref 36.0–46.0)
Hemoglobin: 13.6 g/dL (ref 12.0–15.0)
Lymphocytes Relative: 30 % (ref 12–46)
Lymphs Abs: 3.1 10*3/uL (ref 0.7–4.0)
MCH: 28.7 pg (ref 26.0–34.0)
MCHC: 33.3 g/dL (ref 30.0–36.0)
MCV: 86.1 fL (ref 78.0–100.0)
Monocytes Absolute: 0.6 10*3/uL (ref 0.1–1.0)
Monocytes Relative: 6 % (ref 3–12)
Neutro Abs: 6.2 10*3/uL (ref 1.7–7.7)
Neutrophils Relative %: 61 % (ref 43–77)
Platelets: 252 10*3/uL (ref 150–400)
RBC: 4.74 MIL/uL (ref 3.87–5.11)
RDW: 13 % (ref 11.5–15.5)
WBC: 10.2 10*3/uL (ref 4.0–10.5)

## 2014-12-21 LAB — TYPE AND SCREEN
ABO/RH(D): O POS
Antibody Screen: NEGATIVE

## 2014-12-21 LAB — SURGICAL PCR SCREEN
MRSA, PCR: NEGATIVE
Staphylococcus aureus: POSITIVE — AB

## 2014-12-21 LAB — C-REACTIVE PROTEIN: CRP: 0.7 mg/dL (ref <1.0)

## 2014-12-21 LAB — SEDIMENTATION RATE: Sed Rate: 15 mm/hr (ref 0–22)

## 2014-12-21 LAB — PROTIME-INR
INR: 1 (ref 0.00–1.49)
Prothrombin Time: 13.3 seconds (ref 11.6–15.2)

## 2014-12-21 LAB — APTT: aPTT: 29 seconds (ref 24–37)

## 2014-12-21 NOTE — Progress Notes (Signed)
STOP-Bang score 4; [facility]

## 2014-12-21 NOTE — Pre-Procedure Instructions (Signed)
[name]  12/21/2014   Your procedure is scheduled on:  Friday, May 20  [facility] Admitting at 0530 AM.  Call this number if you have problems the morning of surgery: [phone]   Remember:   Do not eat food or drink liquids after midnight.Thursday night   Take these medicines the morning of surgery with A SIP OF WATER: none; use inhaler as necessary   Do not wear jewelry, make-up or nail polish.  Do not wear lotions, powders, or perfumes.Do not wear deodorant.  Do not shave 48 hours prior to surgery.   Do not bring valuables to the hospital.  [facility] is not responsible   for any belongings or valuables.               Contacts, dentures or bridgework may not be worn into surgery.  Leave suitcase in the car. After surgery it may be brought to your room.  For patients admitted to the hospital, discharge time is determined by your   treatment team.      Special Instructions: [place]- Preparing for Surgery   Please read over the following fact sheets that you were given: Pain Booklet, Coughing and Deep Breathing, Blood Transfusion Information, Total Joint Packet, MRSA Information and Surgical Site Infection Prevention

## 2014-12-21 NOTE — Progress Notes (Signed)
   12/21/14 1548  OBSTRUCTIVE SLEEP APNEA  Have you ever been diagnosed with sleep apnea through a sleep study? No  Do you snore loudly (loud enough to be heard through closed doors)?  0  Do you often feel tired, fatigued, or sleepy during the daytime? 0  Has anyone observed you stop breathing during your sleep? 0  Do you have, or are you being treated for high blood pressure? 1  BMI more than 35 kg/m2? 1  Age over 63 years old? 1  Neck circumference greater than 40 cm/16 inches? 1 (18.5)  Gender: 0

## 2014-12-21 NOTE — Progress Notes (Signed)
I called a prescription for Mupirocin ointment to [facility], [address], [place], [place].

## 2014-12-22 ENCOUNTER — Other Ambulatory Visit (HOSPITAL_COMMUNITY): Payer: Self-pay | Admitting: Orthopaedic Surgery

## 2014-12-22 NOTE — Progress Notes (Signed)
Pt notified of positive PCR of staph. She voiced understanding [facility]

## 2014-12-30 MED ORDER — VANCOMYCIN HCL 10 G IV SOLR
1500.0000 mg | INTRAVENOUS | Status: AC
  Administered 2014-12-31: 1500 mg via INTRAVENOUS
  Filled 2014-12-30: qty 1500

## 2014-12-30 MED ORDER — BUPIVACAINE LIPOSOME 1.3 % IJ SUSP
20.0000 mL | INTRAMUSCULAR | Status: DC
  Filled 2014-12-30 (×2): qty 20

## 2014-12-30 NOTE — H&P (Signed)
PREOPERATIVE H&P  Chief Complaint: left hip degenerative joint disease  HPI: [name] is a 64 y.o. female who presents for surgical treatment of left hip degenerative joint disease.  She denies any changes in medical history.  Past Medical History  Diagnosis Date  . Hypercholesteremia   . HTN (hypertension)   . Diabetes mellitus   . Gout   . COPD (chronic obstructive pulmonary disease)   . SVT (supraventricular tachycardia)   . Anxiety   . Shortness of breath   . Asthma   . Arthritis    Past Surgical History  Procedure Laterality Date  . Hernia repair    . Hysteroscopy    . Dilation and curettage of uterus    . Tubal ligation    . Cardiac catheterization      2010      clean  . Total hip arthroplasty Right 07/16/2013    Procedure: RIGHT TOTAL HIP ARTHROPLASTY ANTERIOR APPROACH;  Surgeon: Cheral AlmasNaiping Michael Xu, MD;  Location: [facility] OR;  Service: Orthopedics;  Laterality: Right;   History   Social History  . Marital Status: Married    Spouse Name: N/A  . Number of Children: 2  . Years of Education: N/A   Occupational History  . unemployed    Social History Main Topics  . Smoking status: Former Smoker -- 1.00 packs/day for 42 years    Types: Cigarettes    Quit date: 05/14/2010  . Smokeless tobacco: Never Used  . Alcohol Use: No  . Drug Use: No  . Sexual Activity: Yes   Other Topics Concern  . Not on file   Social History Narrative   Family History  Problem Relation Age of Onset  . Hypertension Father   . Diabetes Father   . Parkinsonism Father   . Colon cancer Mother   . Alzheimer's disease Mother   . Clotting disorder Mother     had filter placed   Allergies  Allergen Reactions  . Lyrica [Pregabalin]     Patient states Lyrica makes her very dizzy  . Statins     Cause, muscle soreness and shortness of breath   Prior to Admission medications   Medication Sig Start Date End Date Taking? Authorizing Provider  albuterol (PROVENTIL HFA;VENTOLIN HFA)  108 (90 BASE) MCG/ACT inhaler Inhale 2 puffs into the lungs every 6 (six) hours as needed for wheezing or shortness of breath. 11/11/14  Yes Storm FriskPatrick E Wright, MD  aspirin 81 MG tablet Take 81 mg by mouth daily.     Yes Historical Provider, MD  docusate sodium (COLACE) 100 MG capsule Take 100 mg by mouth daily as needed for mild constipation.   Yes Historical Provider, MD  fenofibrate (TRICOR) 145 MG tablet Take 145 mg by mouth daily.  10/15/14  Yes Historical Provider, MD  losartan-hydrochlorothiazide (HYZAAR) 100-25 MG per tablet Take 1 tablet by mouth daily.   Yes Historical Provider, MD  metFORMIN (GLUCOPHAGE) 1000 MG tablet Take 1,000 mg by mouth 2 (two) times daily with a meal.    Yes Historical Provider, MD  mometasone-formoterol (DULERA) 200-5 MCG/ACT AERO Inhale 2 puffs into the lungs 2 (two) times daily. Patient taking differently: Inhale 2 puffs into the lungs daily.  11/11/14  Yes Storm FriskPatrick E Wright, MD  Respiratory Therapy Supplies (FLUTTER) DEVI Use 3-4 times daily 11/11/14  Yes Storm FriskPatrick E Wright, MD  predniSONE (DELTASONE) 10 MG tablet Take 4 for three days 3 for three days 2 for three days 1 for three days  and stop Patient not taking: Reported on 12/17/2014 11/11/14   Storm FriskPatrick E Wright, MD     Positive ROS: All other systems have been reviewed and were otherwise negative with the exception of those mentioned in the HPI and as above.  Physical Exam: General: Alert, no acute distress Cardiovascular: No pedal edema Respiratory: No cyanosis, no use of accessory musculature GI: abdomen soft Skin: No lesions in the area of chief complaint Neurologic: Sensation intact distally Psychiatric: Patient is competent for consent with normal mood and affect Lymphatic: no lymphedema  MUSCULOSKELETAL: exam stable  Assessment: left hip degenerative joint disease  Plan: Plan for Procedure(s): LEFT TOTAL HIP ARTHROPLASTY ANTERIOR APPROACH  The risks benefits and alternatives were discussed with the  patient including but not limited to the risks of nonoperative treatment, versus surgical intervention including infection, bleeding, nerve injury,  blood clots, cardiopulmonary complications, morbidity, mortality, among others, and they were willing to proceed.   Cheral AlmasXu, Naiping Michael, MD   12/30/2014 6:45 PM

## 2014-12-31 ENCOUNTER — Inpatient Hospital Stay (HOSPITAL_COMMUNITY): Payer: Medicare Other | Admitting: Certified Registered"

## 2014-12-31 ENCOUNTER — Encounter (HOSPITAL_COMMUNITY)
Admission: RE | Disposition: A | Discharge status: Home Health | Payer: Self-pay | Source: Ambulatory Visit | Attending: Orthopaedic Surgery

## 2014-12-31 ENCOUNTER — Inpatient Hospital Stay (HOSPITAL_COMMUNITY): Payer: Medicare Other

## 2014-12-31 ENCOUNTER — Inpatient Hospital Stay (HOSPITAL_COMMUNITY)
Admission: RE | Admit: 2014-12-31 | Discharge: 2015-01-02 | DRG: 470 | Disposition: A | Discharge status: Home Health | Payer: Medicare Other | Source: Ambulatory Visit | Attending: Orthopaedic Surgery | Admitting: Orthopaedic Surgery

## 2014-12-31 ENCOUNTER — Encounter (HOSPITAL_COMMUNITY): Payer: Self-pay | Admitting: *Deleted

## 2014-12-31 DIAGNOSIS — Z96641 Presence of right artificial hip joint: Secondary | ICD-10-CM | POA: Diagnosis present

## 2014-12-31 DIAGNOSIS — J45909 Unspecified asthma, uncomplicated: Secondary | ICD-10-CM | POA: Diagnosis present

## 2014-12-31 DIAGNOSIS — E119 Type 2 diabetes mellitus without complications: Secondary | ICD-10-CM | POA: Diagnosis present

## 2014-12-31 DIAGNOSIS — Z79899 Other long term (current) drug therapy: Secondary | ICD-10-CM | POA: Diagnosis not present

## 2014-12-31 DIAGNOSIS — Z419 Encounter for procedure for purposes other than remedying health state, unspecified: Secondary | ICD-10-CM

## 2014-12-31 DIAGNOSIS — J449 Chronic obstructive pulmonary disease, unspecified: Secondary | ICD-10-CM | POA: Diagnosis present

## 2014-12-31 DIAGNOSIS — M109 Gout, unspecified: Secondary | ICD-10-CM | POA: Diagnosis present

## 2014-12-31 DIAGNOSIS — M1612 Unilateral primary osteoarthritis, left hip: Secondary | ICD-10-CM | POA: Diagnosis present

## 2014-12-31 DIAGNOSIS — Z96649 Presence of unspecified artificial hip joint: Secondary | ICD-10-CM

## 2014-12-31 DIAGNOSIS — Z7982 Long term (current) use of aspirin: Secondary | ICD-10-CM

## 2014-12-31 DIAGNOSIS — I10 Essential (primary) hypertension: Secondary | ICD-10-CM | POA: Diagnosis present

## 2014-12-31 DIAGNOSIS — Z7951 Long term (current) use of inhaled steroids: Secondary | ICD-10-CM

## 2014-12-31 DIAGNOSIS — Z888 Allergy status to other drugs, medicaments and biological substances status: Secondary | ICD-10-CM | POA: Diagnosis not present

## 2014-12-31 DIAGNOSIS — Z833 Family history of diabetes mellitus: Secondary | ICD-10-CM

## 2014-12-31 DIAGNOSIS — E780 Pure hypercholesterolemia: Secondary | ICD-10-CM | POA: Diagnosis present

## 2014-12-31 DIAGNOSIS — M1610 Unilateral primary osteoarthritis, unspecified hip: Secondary | ICD-10-CM | POA: Diagnosis present

## 2014-12-31 DIAGNOSIS — Z87891 Personal history of nicotine dependence: Secondary | ICD-10-CM | POA: Diagnosis not present

## 2014-12-31 HISTORY — PX: TOTAL HIP ARTHROPLASTY: SHX124

## 2014-12-31 LAB — GLUCOSE, CAPILLARY
Glucose-Capillary: 170 mg/dL — ABNORMAL HIGH (ref 65–99)
Glucose-Capillary: 130 mg/dL — ABNORMAL HIGH (ref 65–99)
Glucose-Capillary: 153 mg/dL — ABNORMAL HIGH (ref 65–99)

## 2014-12-31 SURGERY — ARTHROPLASTY, HIP, TOTAL, ANTERIOR APPROACH
Anesthesia: Spinal | Site: Hip | Laterality: Left

## 2014-12-31 MED ORDER — INSULIN ASPART 100 UNIT/ML ~~LOC~~ SOLN: 0.0000 [IU] | Freq: Every day | SUBCUTANEOUS | Status: DC

## 2014-12-31 MED ORDER — METOCLOPRAMIDE HCL 5 MG PO TABS: 5.0000 mg | ORAL_TABLET | Freq: Three times a day (TID) | ORAL | Status: DC | PRN

## 2014-12-31 MED ORDER — OXYCODONE HCL 5 MG PO TABS: 5.0000 mg | ORAL_TABLET | ORAL | Status: DC | PRN

## 2014-12-31 MED ORDER — ONDANSETRON HCL 4 MG PO TABS: 4.0000 mg | ORAL_TABLET | Freq: Four times a day (QID) | ORAL | Status: DC | PRN

## 2014-12-31 MED ORDER — PHENYLEPHRINE 40 MCG/ML (10ML) SYRINGE FOR IV PUSH (FOR BLOOD PRESSURE SUPPORT)
PREFILLED_SYRINGE | INTRAVENOUS | Status: AC
  Filled 2014-12-31: qty 10

## 2014-12-31 MED ORDER — PROPOFOL 10 MG/ML IV BOLUS
INTRAVENOUS | Status: AC
  Filled 2014-12-31: qty 20

## 2014-12-31 MED ORDER — CHLORHEXIDINE GLUCONATE 4 % EX LIQD
60.0000 mL | Freq: Once | CUTANEOUS | Status: DC
  Filled 2014-12-31: qty 60

## 2014-12-31 MED ORDER — METHOCARBAMOL 1000 MG/10ML IJ SOLN
500.0000 mg | Freq: Four times a day (QID) | INTRAVENOUS | Status: DC | PRN
  Filled 2014-12-31: qty 5

## 2014-12-31 MED ORDER — LACTATED RINGERS IV SOLN
INTRAVENOUS | Status: DC | PRN
  Administered 2014-12-31 (×2): via INTRAVENOUS

## 2014-12-31 MED ORDER — INSULIN ASPART 100 UNIT/ML ~~LOC~~ SOLN
0.0000 [IU] | Freq: Three times a day (TID) | SUBCUTANEOUS | Status: DC
  Administered 2014-12-31 – 2015-01-01 (×2): 3 [IU] via SUBCUTANEOUS
  Administered 2015-01-01: 100 [IU] via SUBCUTANEOUS
  Administered 2015-01-01: 5 [IU] via SUBCUTANEOUS
  Administered 2015-01-02: 3 [IU] via SUBCUTANEOUS

## 2014-12-31 MED ORDER — LOSARTAN POTASSIUM 50 MG PO TABS
100.0000 mg | ORAL_TABLET | Freq: Every day | ORAL | Status: DC
  Administered 2014-12-31 – 2015-01-02 (×3): 100 mg via ORAL
  Filled 2014-12-31 (×4): qty 2

## 2014-12-31 MED ORDER — ACETAMINOPHEN 500 MG PO TABS
1000.0000 mg | ORAL_TABLET | Freq: Four times a day (QID) | ORAL | Status: AC
  Administered 2014-12-31 – 2015-01-01 (×4): 1000 mg via ORAL
  Filled 2014-12-31 (×4): qty 2

## 2014-12-31 MED ORDER — PROPOFOL INFUSION 10 MG/ML OPTIME
INTRAVENOUS | Status: DC | PRN
  Administered 2014-12-31: 20 ug/kg/min via INTRAVENOUS
  Administered 2014-12-31: 10:00:00 via INTRAVENOUS

## 2014-12-31 MED ORDER — MAGNESIUM CITRATE PO SOLN: 1.0000 | Freq: Once | ORAL | Status: AC | PRN

## 2014-12-31 MED ORDER — LIDOCAINE HCL (CARDIAC) 20 MG/ML IV SOLN
INTRAVENOUS | Status: AC
  Filled 2014-12-31: qty 5

## 2014-12-31 MED ORDER — ASPIRIN EC 325 MG PO TBEC
325.0000 mg | DELAYED_RELEASE_TABLET | Freq: Two times a day (BID) | ORAL | Status: DC
  Administered 2014-12-31 – 2015-01-02 (×4): 325 mg via ORAL
  Filled 2014-12-31 (×4): qty 1

## 2014-12-31 MED ORDER — KETOROLAC TROMETHAMINE 30 MG/ML IJ SOLN
30.0000 mg | Freq: Once | INTRAMUSCULAR | Status: AC | PRN
  Administered 2014-12-31: 30 mg via INTRAVENOUS

## 2014-12-31 MED ORDER — ALUM & MAG HYDROXIDE-SIMETH 200-200-20 MG/5ML PO SUSP: 30.0000 mL | ORAL | Status: DC | PRN

## 2014-12-31 MED ORDER — OXYCODONE HCL 5 MG PO TABS
5.0000 mg | ORAL_TABLET | ORAL | Status: DC | PRN
  Administered 2014-12-31 – 2015-01-01 (×3): 10 mg via ORAL
  Filled 2014-12-31 (×3): qty 2

## 2014-12-31 MED ORDER — HYDROMORPHONE HCL 1 MG/ML IJ SOLN
INTRAMUSCULAR | Status: AC
  Filled 2014-12-31: qty 1

## 2014-12-31 MED ORDER — SORBITOL 70 % SOLN: 30.0000 mL | Freq: Every day | Status: DC | PRN

## 2014-12-31 MED ORDER — POVIDONE-IODINE 10 % EX SOLN
CUTANEOUS | Status: DC | PRN
  Administered 2014-12-31: 1 via TOPICAL

## 2014-12-31 MED ORDER — SODIUM CHLORIDE 0.9 % IR SOLN
Status: DC | PRN
  Administered 2014-12-31: 3000 mL

## 2014-12-31 MED ORDER — KETOROLAC TROMETHAMINE 30 MG/ML IJ SOLN
INTRAMUSCULAR | Status: AC
  Filled 2014-12-31: qty 1

## 2014-12-31 MED ORDER — MENTHOL 3 MG MT LOZG: 1.0000 | LOZENGE | OROMUCOSAL | Status: DC | PRN

## 2014-12-31 MED ORDER — METOCLOPRAMIDE HCL 5 MG/ML IJ SOLN: 5.0000 mg | Freq: Three times a day (TID) | INTRAMUSCULAR | Status: DC | PRN

## 2014-12-31 MED ORDER — EPHEDRINE SULFATE 50 MG/ML IJ SOLN
INTRAMUSCULAR | Status: AC
  Filled 2014-12-31: qty 1

## 2014-12-31 MED ORDER — METHOCARBAMOL 500 MG PO TABS
500.0000 mg | ORAL_TABLET | Freq: Four times a day (QID) | ORAL | Status: DC | PRN
  Administered 2015-01-01: 500 mg via ORAL
  Filled 2014-12-31 (×2): qty 1

## 2014-12-31 MED ORDER — VANCOMYCIN HCL IN DEXTROSE 1-5 GM/200ML-% IV SOLN
1000.0000 mg | Freq: Two times a day (BID) | INTRAVENOUS | Status: AC
  Administered 2014-12-31: 1000 mg via INTRAVENOUS
  Filled 2014-12-31: qty 200

## 2014-12-31 MED ORDER — ACETAMINOPHEN 650 MG RE SUPP
650.0000 mg | Freq: Four times a day (QID) | RECTAL | Status: DC | PRN
  Filled 2014-12-31: qty 1

## 2014-12-31 MED ORDER — SENNA 8.6 MG PO TABS
1.0000 | ORAL_TABLET | Freq: Two times a day (BID) | ORAL | Status: DC
  Administered 2014-12-31 – 2015-01-02 (×5): 8.6 mg via ORAL
  Filled 2014-12-31 (×5): qty 1

## 2014-12-31 MED ORDER — ACETAMINOPHEN 325 MG PO TABS
650.0000 mg | ORAL_TABLET | Freq: Four times a day (QID) | ORAL | Status: DC | PRN
  Filled 2014-12-31: qty 2

## 2014-12-31 MED ORDER — DOCUSATE SODIUM 100 MG PO CAPS: 100.0000 mg | ORAL_CAPSULE | Freq: Every day | ORAL | Status: DC | PRN

## 2014-12-31 MED ORDER — POLYETHYLENE GLYCOL 3350 17 G PO PACK: 17.0000 g | PACK | Freq: Every day | ORAL | Status: DC | PRN

## 2014-12-31 MED ORDER — MIDAZOLAM HCL 2 MG/2ML IJ SOLN
INTRAMUSCULAR | Status: AC
  Filled 2014-12-31: qty 2

## 2014-12-31 MED ORDER — ALBUTEROL SULFATE HFA 108 (90 BASE) MCG/ACT IN AERS: 2.0000 | INHALATION_SPRAY | Freq: Four times a day (QID) | RESPIRATORY_TRACT | Status: DC | PRN

## 2014-12-31 MED ORDER — SUCCINYLCHOLINE CHLORIDE 20 MG/ML IJ SOLN
INTRAMUSCULAR | Status: AC
  Filled 2014-12-31: qty 1

## 2014-12-31 MED ORDER — MIDAZOLAM HCL 5 MG/5ML IJ SOLN
INTRAMUSCULAR | Status: DC | PRN
  Administered 2014-12-31: 2 mg via INTRAVENOUS

## 2014-12-31 MED ORDER — OXYCODONE HCL ER 10 MG PO T12A: 10.0000 mg | EXTENDED_RELEASE_TABLET | Freq: Two times a day (BID) | ORAL | Status: DC

## 2014-12-31 MED ORDER — HYDROCHLOROTHIAZIDE 25 MG PO TABS
25.0000 mg | ORAL_TABLET | Freq: Every day | ORAL | Status: DC
  Administered 2014-12-31 – 2015-01-02 (×3): 25 mg via ORAL
  Filled 2014-12-31 (×3): qty 1

## 2014-12-31 MED ORDER — DIPHENHYDRAMINE HCL 12.5 MG/5ML PO ELIX: 25.0000 mg | ORAL_SOLUTION | ORAL | Status: DC | PRN

## 2014-12-31 MED ORDER — PHENOL 1.4 % MT LIQD: 1.0000 | OROMUCOSAL | Status: DC | PRN

## 2014-12-31 MED ORDER — ROCURONIUM BROMIDE 50 MG/5ML IV SOLN
INTRAVENOUS | Status: AC
  Filled 2014-12-31: qty 1

## 2014-12-31 MED ORDER — KETOROLAC TROMETHAMINE 30 MG/ML IJ SOLN: 30.0000 mg | Freq: Four times a day (QID) | INTRAMUSCULAR | Status: DC | PRN

## 2014-12-31 MED ORDER — LOSARTAN POTASSIUM-HCTZ 100-25 MG PO TABS: 1.0000 | ORAL_TABLET | Freq: Every day | ORAL | Status: DC

## 2014-12-31 MED ORDER — MEPERIDINE HCL 25 MG/ML IJ SOLN: 6.2500 mg | INTRAMUSCULAR | Status: DC | PRN

## 2014-12-31 MED ORDER — MOMETASONE FURO-FORMOTEROL FUM 200-5 MCG/ACT IN AERO
2.0000 | INHALATION_SPRAY | Freq: Two times a day (BID) | RESPIRATORY_TRACT | Status: DC
  Administered 2015-01-01 – 2015-01-02 (×3): 2 via RESPIRATORY_TRACT
  Filled 2014-12-31: qty 8.8

## 2014-12-31 MED ORDER — ASPIRIN EC 325 MG PO TBEC: 325.0000 mg | DELAYED_RELEASE_TABLET | Freq: Two times a day (BID) | ORAL | Status: DC

## 2014-12-31 MED ORDER — PHENYLEPHRINE HCL 10 MG/ML IJ SOLN
10.0000 mg | INTRAMUSCULAR | Status: DC | PRN
  Administered 2014-12-31: 40 ug/min via INTRAVENOUS

## 2014-12-31 MED ORDER — TRANEXAMIC ACID 1000 MG/10ML IV SOLN
1000.0000 mg | INTRAVENOUS | Status: AC
  Administered 2014-12-31: 1000 mg via INTRAVENOUS
  Filled 2014-12-31 (×2): qty 10

## 2014-12-31 MED ORDER — PROMETHAZINE HCL 25 MG/ML IJ SOLN: 6.2500 mg | INTRAMUSCULAR | Status: DC | PRN

## 2014-12-31 MED ORDER — 0.9 % SODIUM CHLORIDE (POUR BTL) OPTIME
TOPICAL | Status: DC | PRN
  Administered 2014-12-31: 500 mL

## 2014-12-31 MED ORDER — MORPHINE SULFATE 2 MG/ML IJ SOLN: 1.0000 mg | INTRAMUSCULAR | Status: DC | PRN

## 2014-12-31 MED ORDER — ONDANSETRON HCL 4 MG/2ML IJ SOLN: 4.0000 mg | Freq: Four times a day (QID) | INTRAMUSCULAR | Status: DC | PRN

## 2014-12-31 MED ORDER — FENTANYL CITRATE (PF) 250 MCG/5ML IJ SOLN
INTRAMUSCULAR | Status: AC
  Filled 2014-12-31: qty 5

## 2014-12-31 MED ORDER — FENTANYL CITRATE (PF) 100 MCG/2ML IJ SOLN
INTRAMUSCULAR | Status: DC | PRN
  Administered 2014-12-31: 50 ug via INTRAVENOUS

## 2014-12-31 MED ORDER — ALBUTEROL SULFATE (2.5 MG/3ML) 0.083% IN NEBU: 2.5000 mg | INHALATION_SOLUTION | Freq: Four times a day (QID) | RESPIRATORY_TRACT | Status: DC | PRN

## 2014-12-31 MED ORDER — SODIUM CHLORIDE 0.9 % IV SOLN
INTRAVENOUS | Status: DC
  Administered 2014-12-31: 19:00:00 via INTRAVENOUS

## 2014-12-31 MED ORDER — OXYCODONE HCL ER 10 MG PO T12A
10.0000 mg | EXTENDED_RELEASE_TABLET | Freq: Two times a day (BID) | ORAL | Status: DC
  Administered 2014-12-31 – 2015-01-01 (×4): 10 mg via ORAL
  Filled 2014-12-31 (×4): qty 1

## 2014-12-31 MED ORDER — HYDROMORPHONE HCL 1 MG/ML IJ SOLN
0.2500 mg | INTRAMUSCULAR | Status: DC | PRN
  Administered 2014-12-31: 0.5 mg via INTRAVENOUS

## 2014-12-31 MED ORDER — PHENYLEPHRINE HCL 10 MG/ML IJ SOLN
INTRAMUSCULAR | Status: DC | PRN
  Administered 2014-12-31 (×2): 120 ug via INTRAVENOUS
  Administered 2014-12-31 (×3): 80 ug via INTRAVENOUS

## 2014-12-31 MED ORDER — SODIUM CHLORIDE 0.9 % IJ SOLN
INTRAMUSCULAR | Status: AC
  Filled 2014-12-31: qty 10

## 2014-12-31 SURGICAL SUPPLY — 52 items
ACETAB CUP W/GRIPTION 54 (Plate) ×2 IMPLANT
ALTRX, ACETABULAR LINER NEUTRAL 36mm ID, 54mm OD ×2 IMPLANT
BLADE SAW SGTL 18X1.27X75 (BLADE) ×2 IMPLANT
BNDG COHESIVE 6X5 TAN STRL LF (GAUZE/BANDAGES/DRESSINGS) ×2 IMPLANT
CAPT HIP TOTAL 2 ×2 IMPLANT
CELLS DAT CNTRL 66122 CELL SVR (MISCELLANEOUS) ×1 IMPLANT
COVER SURGICAL LIGHT HANDLE (MISCELLANEOUS) ×2 IMPLANT
CUP ACETAB W/GRIPTION 54 (Plate) ×1 IMPLANT
DRAPE C-ARM 42X72 X-RAY (DRAPES) ×2 IMPLANT
DRAPE IMP U-DRAPE 54X76 (DRAPES) ×2 IMPLANT
DRAPE STERI IOBAN 125X83 (DRAPES) ×2 IMPLANT
DRAPE U-SHAPE 47X51 STRL (DRAPES) ×6 IMPLANT
DRSG AQUACEL AG ADV 3.5X10 (GAUZE/BANDAGES/DRESSINGS) ×2 IMPLANT
DRSG MEPILEX BORDER 4X8 (GAUZE/BANDAGES/DRESSINGS) IMPLANT
DURAPREP 26ML APPLICATOR (WOUND CARE) ×2 IMPLANT
ELECT BLADE 4.0 EZ CLEAN MEGAD (MISCELLANEOUS) ×2
ELECT REM PT RETURN 9FT ADLT (ELECTROSURGICAL) ×2
ELECTRODE BLDE 4.0 EZ CLN MEGD (MISCELLANEOUS) ×1 IMPLANT
ELECTRODE REM PT RTRN 9FT ADLT (ELECTROSURGICAL) ×1 IMPLANT
FACESHIELD WRAPAROUND (MASK) ×2 IMPLANT
GLOVE NEODERM STRL 7.5 LF PF (GLOVE) ×2 IMPLANT
GLOVE SURG NEODERM 7.5  LF PF (GLOVE) ×2
GLOVE SURG SYN 7.5  E (GLOVE) ×1
GLOVE SURG SYN 7.5 E (GLOVE) ×1 IMPLANT
GOWN SRG XL XLNG 56XLVL 4 (GOWN DISPOSABLE) ×1 IMPLANT
GOWN STRL NON-REIN XL XLG LVL4 (GOWN DISPOSABLE) ×1
GOWN STRL REUS W/ TWL LRG LVL3 (GOWN DISPOSABLE) IMPLANT
GOWN STRL REUS W/TWL LRG LVL3 (GOWN DISPOSABLE)
HANDPIECE INTERPULSE COAX TIP (DISPOSABLE) ×1
HEAD CERAMIC 36 PLUS 8.5 12 14 (Hips) ×2 IMPLANT
KIT BASIN OR (CUSTOM PROCEDURE TRAY) ×2 IMPLANT
MARKER SKIN DUAL TIP RULER LAB (MISCELLANEOUS) ×2 IMPLANT
PACK TOTAL JOINT (CUSTOM PROCEDURE TRAY) ×2 IMPLANT
PACK UNIVERSAL I (CUSTOM PROCEDURE TRAY) ×2 IMPLANT
PADDING CAST COTTON 6X4 STRL (CAST SUPPLIES) ×2 IMPLANT
PIN SECTOR W/GRIP ACE CUP 52MM (Hips) ×2 IMPLANT
RTRCTR WOUND ALEXIS 18CM MED (MISCELLANEOUS) ×2
SEALER BIPOLAR AQUA 6.0 (INSTRUMENTS) IMPLANT
SET HNDPC FAN SPRY TIP SCT (DISPOSABLE) ×1 IMPLANT
SOLUTION BETADINE 4OZ (MISCELLANEOUS) ×2 IMPLANT
SUT ETHIBOND 2 V 37 (SUTURE) ×2 IMPLANT
SUT ETHIBOND NAB CT1 #1 30IN (SUTURE) ×6 IMPLANT
SUT ETHILON 3 0 FSL (SUTURE) ×2 IMPLANT
SUT VIC AB 0 CT1 27 (SUTURE) ×2
SUT VIC AB 0 CT1 27XBRD ANBCTR (SUTURE) ×2 IMPLANT
SUT VIC AB 1 CT1 27 (SUTURE) ×1
SUT VIC AB 1 CT1 27XBRD ANBCTR (SUTURE) ×1 IMPLANT
SUT VIC AB 2-0 CT1 27 (SUTURE) ×2
SUT VIC AB 2-0 CT1 TAPERPNT 27 (SUTURE) ×2 IMPLANT
SYR 20CC LL (SYRINGE) ×2 IMPLANT
TOWEL OR 17X26 10 PK STRL BLUE (TOWEL DISPOSABLE) ×2 IMPLANT
TRAY FOLEY CATH 16FR SILVER (SET/KITS/TRAYS/PACK) ×2 IMPLANT

## 2014-12-31 NOTE — Progress Notes (Addendum)
C/o severe pain d/t urinary catheter placement. Returned to bed - is w/o a leg strap supporting catheter positioning. Attempted to promote increased comfort by deflating bulb and repositioning w/o success - continues to c/o severe pain. Removed Foley catheter. Is up on BSC to urinate. Urinated >400 cc. States pain resolved

## 2014-12-31 NOTE — Anesthesia Postprocedure Evaluation (Signed)
Anesthesia Post Note  Patient: Brandy Scott  Procedure(s) Performed: Procedure(s) (LRB): LEFT TOTAL HIP ARTHROPLASTY ANTERIOR APPROACH (Left)  Anesthesia type: Spinal  Patient location: PACU  Post pain: Pain level controlled  Post assessment: Post-op Vital signs reviewed  Last Vitals: BP 126/49 mmHg  Pulse 64  Temp(Src) 36.4 C (Oral)  Resp 16  Wt 246 lb 8 oz (111.812 kg)  SpO2 100%  Post vital signs: Reviewed  Level of consciousness: sedated  Complications: No apparent anesthesia complications

## 2014-12-31 NOTE — Anesthesia Preprocedure Evaluation (Signed)
Anesthesia Evaluation  Patient identified by MRN, date of birth, ID band Patient awake    Reviewed: Allergy & Precautions, NPO status , Patient's Chart, lab work & pertinent test results  Airway Mallampati: II  TM Distance: >3 FB Neck ROM: Full    Dental no notable dental hx.    Pulmonary shortness of breath and with exertion, asthma , COPDformer smoker,  breath sounds clear to auscultation  Pulmonary exam normal       Cardiovascular hypertension, Pt. on medications Normal cardiovascular examRhythm:Regular Rate:Normal     Neuro/Psych PSYCHIATRIC DISORDERS Anxiety negative neurological ROS     GI/Hepatic negative GI ROS, Neg liver ROS,   Endo/Other  negative endocrine ROSdiabetes, Type 2, Oral Hypoglycemic AgentsMorbid obesity  Renal/GU negative Renal ROS     Musculoskeletal  (+) Arthritis -,   Abdominal   Peds  Hematology negative hematology ROS (+)   Anesthesia Other Findings   Reproductive/Obstetrics negative OB ROS                             Anesthesia Physical Anesthesia Plan  ASA: III  Anesthesia Plan: Spinal   Post-op Pain Management:    Induction:   Airway Management Planned:   Additional Equipment:   Intra-op Plan:   Post-operative Plan:   Informed Consent: I have reviewed the patients History and Physical, chart, labs and discussed the procedure including the risks, benefits and alternatives for the proposed anesthesia with the patient or authorized representative who has indicated his/her understanding and acceptance.   Dental advisory given  Plan Discussed with: CRNA  Anesthesia Plan Comments:         Anesthesia Quick Evaluation

## 2014-12-31 NOTE — H&P (Signed)
H&P update  The surgical history has been reviewed and remains accurate without interval change.  The patient was re-examined and patient's physiologic condition has not changed significantly in the last 30 days. The condition still exists that makes this procedure necessary. The treatment plan remains the same, without new options for care.  No new pharmacological allergies or types of therapy has been initiated that would change the plan or the appropriateness of the plan.  The patient and/or family understand the potential benefits and risks.  N. Michael Xu, MD 12/31/2014 7:39 AM   

## 2014-12-31 NOTE — Transfer of Care (Signed)
Immediate Anesthesia Transfer of Care Note  Patient: Brandy Scott  Procedure(s) Performed: Procedure(s): LEFT TOTAL HIP ARTHROPLASTY ANTERIOR APPROACH (Left)  Patient Location: PACU  Anesthesia Type:Spinal  Level of Consciousness: awake, alert , oriented and patient cooperative  Airway & Oxygen Therapy: Patient Spontanous Breathing and Patient connected to nasal cannula oxygen  Post-op Assessment: Report given to RN and Post -op Vital signs reviewed and stable  Post vital signs: Reviewed and stable  Last Vitals:  Filed Vitals:   12/31/14 0631  BP: 150/62  Temp: 37.2 C  Resp: 18    Complications: No apparent anesthesia complications

## 2014-12-31 NOTE — Op Note (Signed)
LEFT TOTAL HIP ARTHROPLASTY ANTERIOR APPROACH  Procedure Note Brandy Scott   [id number]  Pre-op Diagnosis: left hip degenerative joint disease     Post-op Diagnosis: same   Operative Procedures  1. Total hip replacement; Left hip; uncemented [id number]   Personnel  Surgeon(s): Tarry KosNaiping M Xu, MD   Anesthesia: spinal, epidural   Prosthesis: Depuy Acetabulum: Pinnacle Gription 54 mm 3 hole Femur: Corail KA 11 Head: 36 size: +8.5 Bearing Type: Ceramic on poly  Date of Service: 12/31/2014  Total Hip Arthroplasty (Anterior Approach) Op Note:  After informed consent was obtained and the operative extremity marked in the holding area, the patient was brought back to the operating room and placed supine on the HANA table. Next, the operative extremity was prepped and draped in normal sterile fashion. Surgical timeout occurred verifying patient identification, surgical site, surgical procedure and administration of antibiotics.  A modified anterior Smith-Peterson approach to the hip was performed, using the interval between tensor fascia lata and sartorius.  Dissection was carried bluntly down onto the anterior hip capsule. The lateral femoral circumflex vessels were identified and coagulated. A capsulotomy was performed and the capsular flaps tagged for later repair.  Fluoroscopy was utilized to prepare for the femoral neck cut. The neck osteotomy was performed. The femoral head was removed, the acetabular rim was cleared of soft tissue and attention was turned to reaming the acetabulum.  Sequential reaming was performed under fluoroscopic guidance. We reamed to a size 54 mm, and then impacted the acetabular shell. The liner was then placed after irrigation and attention turned to the femur.  After placing the femoral hook, the leg was taken to externally rotated, extended and adducted position taking care to perform soft tissue releases to allow for adequate mobilization of the femur. Soft  tissue was cleared from the shoulder of the greater trochanter and the hook elevator used to improve exposure of the proximal femur. Sequential broaching performed up to a size 11. Trial neck and head were placed. The leg was brought back up to neutral and the construct reduced. The position and sizing of components, offset and leg lengths were checked using fluoroscopy. Stability of the  construct was checked in extension and external rotation without any subluxation or impingement of prosthesis. We dislocated the prosthesis, dropped the leg back into position, removed trial components, and irrigated copiously. The final stem and head was then placed, the leg brought back up, the system reduced and fluoroscopy used to verify positioning.  We irrigated, obtained hemostasis and closed the capsule using #1 ethibond suture.  Dilute betadyne solution was used. The fascia was closed with #1 vicryl plus, the deep fat layer was closed with 0 vicryl, the subcutaneous layers closed with 2.0 Vicryl Plus and the skin closed with 2.0 nylon and dermabond. A sterile dressing was applied. The patient was awakened in the operating room and taken to recovery in stable condition.  All sponge, needle, and instrument counts were correct at the end of the case.   Position: supine  Complications: none.  Time Out: performed   Drains/Packing: none  Estimated blood loss: 400 cc  Returned to Recovery Room: in good condition.   Antibiotics: yes   Mechanical VTE (DVT) Prophylaxis: sequential compression devices, TED thigh-high  Chemical VTE (DVT) Prophylaxis: aspirin (other pharmacologic prophylaxis not indicated - bleeding risk)   Fluid Replacement: see anesthesia record  Specimens Removed: 1 to pathology   Sponge and Instrument Count Correct? yes   PACU: portable radiograph -  low AP   Admission: inpatient status, start PT & OT POD#1  Plan/RTC: Return in 2 weeks for staple removal. Return in 6 weeks to see MD.    Weight Bearing/Load Lower Extremity: full  Hip precautions: none Suture Removal: 10-14 days  Betadine to incision twice daily once dressing is removed on POD#7  N. Glee ArvinMichael Xu, MD [facility] [phone] 10:50 AM      Implant Name Type Inv. Item Serial No. Manufacturer Lot No. LRB No. Used  PIN SECTOR W/GRIP ACE CUP 52MM - [id number] Hips PIN SECTOR W/GRIP ACE CUP 52MM  DEPUY [id number] Left 1  ACETABULAR CUP Cathe MonsW GRIPTION 54MM - [id number] Plate ACETABULAR CUP W GRIPTION 54MM  DEPUY [id number] Left 1  SCREW 6.5MMX25MM - [id number] Screw SCREW 6.5MMX25MM  DEPUY [id number] Left 1  ALTRX, ACETABULAR LINER NEUTRAL 36mm ID, 54mm OD    [employer] [id number] Left 1  STEM CORAIL KA11 - [id number] Stem STEM CORAIL KA11  DEPUY [id number] Left 1  HEAD CERAMIC 36 PLUS 8.5 12 14  - [id number] Hips HEAD CERAMIC 36 PLUS 8.5 12 14    DEPUY [id number] Left 1

## 2014-12-31 NOTE — Discharge Instructions (Signed)

## 2014-12-31 NOTE — Evaluation (Signed)
Physical Therapy Evaluation Patient Details Name: Brandy Scott Perman MRN: [id number] DOB: Jan 13, 1951 Today's Date: 12/31/2014   History of Present Illness  Patient is a 64 y/o female s/p L THA, direct anterior approach. PMH of HTN, DM, COPD, SVT, anxiety and asthma.  Clinical Impression  Patient presents with pain, generalized weakness and nausea s/p above surgery impacting mobility. Tolerated transfers and short distance ambulation with min A for safety. Pt will have 24/7 S at home. Would benefit from skilled PT to improve transfers, gait, balance and mobility so pt can maximize independence and return to PLOF.    Follow Up Recommendations Home health PT;Supervision/Assistance - 24 hour    Equipment Recommendations  None recommended by PT    Recommendations for Other Services       Precautions / Restrictions Precautions Precautions: Fall Precaution Comments: Direct anterior approach. Restrictions Weight Bearing Restrictions: Yes LLE Weight Bearing: Weight bearing as tolerated      Mobility  Bed Mobility Overal bed mobility: Needs Assistance Bed Mobility: Rolling;Sidelying to Sit Rolling: Min guard Sidelying to sit: HOB elevated;Mod assist       General bed mobility comments: Assist to bring LLE to EOB and to elevate trunk. Increased time. "woozy" upon sitting EOB.  Transfers Overall transfer level: Needs assistance Equipment used: Rolling walker (2 wheeled) Transfers: Sit to/from Stand Sit to Stand: Min assist         General transfer comment: Min A to rise from EOB with cues for hand placement. Cues for upright posture.   Ambulation/Gait Ambulation/Gait assistance: Min assist Ambulation Distance (Feet): 6 Feet Assistive device: Rolling walker (2 wheeled) Gait Pattern/deviations: Step-to pattern;Decreased stance time - left;Decreased step length - right;Step-through pattern;Trunk flexed   Gait velocity interpretation: Below normal speed for age/gender General  Gait Details: Pt with cues to advance LLE and assist with weightshifting. Cues for upright posture. Min A for balance/safety.  Stairs            Wheelchair Mobility    Modified Rankin (Stroke Patients Only)       Balance Overall balance assessment: Needs assistance Sitting-balance support: Feet supported;No upper extremity supported Sitting balance-Leahy Scale: Fair     Standing balance support: During functional activity Standing balance-Leahy Scale: Poor Standing balance comment: Relient on RW for support.                              Pertinent Vitals/Pain Pain Assessment: 0-10 Pain Score: 4  Pain Location: left hip Pain Descriptors / Indicators: Sore;Aching Pain Intervention(s): Limited activity within patient's tolerance;Monitored during session;Repositioned;RN gave pain meds during session    Home Living Family/patient expects to be discharged to:: Private residence Living Arrangements: Spouse/significant other Available Help at Discharge: Family;Available 24 hours/day Type of Home: House Home Access: Stairs to enter Entrance Stairs-Rails: Right Entrance Stairs-Number of Steps: 5 Home Layout: One level Home Equipment: Walker - 2 wheels;Cane - single point;Toilet riser      Prior Function Level of Independence: Independent               Hand Dominance   Dominant Hand: Right    Extremity/Trunk Assessment   Upper Extremity Assessment: Defer to OT evaluation           Lower Extremity Assessment: LLE deficits/detail;Generalized weakness   LLE Deficits / Details: Limited AROM secondary to pain/post surgical deficits.      Communication   Communication: No difficulties  Cognition Arousal/Alertness: Awake/alert Behavior During  Therapy: WFL for tasks assessed/performed Overall Cognitive Status: Within Functional Limits for tasks assessed                      General Comments General comments (skin integrity, edema,  etc.): Pt's spouse and sister present in room during evaluation.    Exercises Total Joint Exercises Ankle Circles/Pumps: Both;15 reps;Supine Quad Sets: Both;5 reps;Supine Gluteal Sets: Both;10 reps;Seated      Assessment/Plan    PT Assessment Patient needs continued PT services  PT Diagnosis Difficulty walking;Acute pain   PT Problem List Decreased strength;Pain;Decreased range of motion;Impaired sensation;Decreased activity tolerance;Decreased balance;Decreased mobility  PT Treatment Interventions Balance training;Gait training;Stair training;Functional mobility training;Therapeutic activities;Therapeutic exercise;Patient/family education   PT Goals (Current goals can be found in the Care Plan section) Acute Rehab PT Goals Patient Stated Goal: to return home PT Goal Formulation: With patient Time For Goal Achievement: 01/14/15 Potential to Achieve Goals: Good    Frequency 7X/week   Barriers to discharge Inaccessible home environment 5 steps to climb to get into home.    Co-evaluation               End of Session Equipment Utilized During Treatment: Gait belt Activity Tolerance: Patient tolerated treatment well Patient left: in chair;with call bell/phone within reach;with family/visitor present Nurse Communication: Mobility status         Time: 1534-1600 PT Time Calculation (min) (ACUTE ONLY): 26 min   Charges:   PT Evaluation $Initial PT Evaluation Tier I: 1 Procedure PT Treatments $Therapeutic Activity: 8-22 mins   PT G Codes:        Sadie Hazelett A Afua Hoots 12/31/2014, 4:13 PM Mylo RedShauna Elverna Caffee, PT, DPT [phone]

## 2015-01-01 LAB — GLUCOSE, CAPILLARY
Glucose-Capillary: 176 mg/dL — ABNORMAL HIGH (ref 65–99)
Glucose-Capillary: 182 mg/dL — ABNORMAL HIGH (ref 65–99)
Glucose-Capillary: 202 mg/dL — ABNORMAL HIGH (ref 65–99)
Glucose-Capillary: 187 mg/dL — ABNORMAL HIGH (ref 65–99)
Glucose-Capillary: 140 mg/dL — ABNORMAL HIGH (ref 65–99)

## 2015-01-01 LAB — CBC
HCT: 31.3 % — ABNORMAL LOW (ref 36.0–46.0)
Hemoglobin: 10.4 g/dL — ABNORMAL LOW (ref 12.0–15.0)
MCH: 28.9 pg (ref 26.0–34.0)
MCHC: 33.2 g/dL (ref 30.0–36.0)
MCV: 86.9 fL (ref 78.0–100.0)
Platelets: 164 10*3/uL (ref 150–400)
RBC: 3.6 MIL/uL — ABNORMAL LOW (ref 3.87–5.11)
RDW: 13.3 % (ref 11.5–15.5)
WBC: 11.7 10*3/uL — ABNORMAL HIGH (ref 4.0–10.5)

## 2015-01-01 LAB — BASIC METABOLIC PANEL
Anion gap: 7 (ref 5–15)
BUN: 17 mg/dL (ref 6–20)
CO2: 28 mmol/L (ref 22–32)
Calcium: 8.7 mg/dL — ABNORMAL LOW (ref 8.9–10.3)
Chloride: 100 mmol/L — ABNORMAL LOW (ref 101–111)
Creatinine, Ser: 1.07 mg/dL — ABNORMAL HIGH (ref 0.44–1.00)
GFR calc Af Amer: >60 mL/min (ref >60)
GFR calc non Af Amer: 54 mL/min — ABNORMAL LOW (ref >60)
Glucose, Bld: 172 mg/dL — ABNORMAL HIGH (ref 65–99)
Potassium: 3.9 mmol/L (ref 3.5–5.1)
Sodium: 135 mmol/L (ref 135–145)

## 2015-01-01 NOTE — Care Management Note (Signed)
Case Management Note  Patient Details  Name: Brandy Scott MRN: [id number] Date of Birth: 1950-11-01  Subjective/Objective:      Patient came from home and had Left hip surgery              Action/Plan:  01/01/15. 01/01/15  CM spoke with patient in her room with her husband at bedside, [name] and will like to use the agency again for her HHPT.  Patient says her husband will be providing  24 hr supervision/assistance post discharge. CM [facility] and [name] [phone] to arrange HHPT care for patient.  Expected Discharge Date:   01/02/15               Expected Discharge Plan:  Home w Home Health Services  In-House Referral:  NA  Discharge planning Services  CM Consult  Post Acute Care Choice:    Choice offered to:     DME Arranged:    DME Agency:     HH Arranged:  PT [facility]  Status of Service:  Completed, signed off  Medicare Important Message Given:    Date Medicare IM Given:    Medicare IM give by:    Date Additional Medicare IM Given:    Additional Medicare Important Message give by:     If discussed at Long Length of Stay Meetings, dates discussed:    Additional Comments:  [name], [name], RN 01/01/2015, 11:38 AM

## 2015-01-01 NOTE — Clinical Social Work Note (Signed)
Clinical Social Worker received referral for possible ST-SNF placement.  Chart reviewed.  PT/OT recommending home with home health and 24 hour supervision.  Per PT note, patient will have adequate 24 hour support at home.  Spoke with RN Case Manager who will follow up with patient to discuss home health needs.    CSW signing off - please re consult if social work needs arise.  Brandy Scott, [place] [phone]

## 2015-01-01 NOTE — Progress Notes (Signed)
Subjective: Patient stable pain controlled   Objective: Vital signs in last 24 hours: Temp:  [96.8 F (36 C)-99.3 F (37.4 C)] 98.9 F (37.2 C) (05/21 0620) Pulse Rate:  [57-102] 98 (05/21 0620) Resp:  [9-16] 16 (05/21 0620) BP: (108-136)/(45-60) 120/52 mmHg (05/21 0620) SpO2:  [92 %-100 %] 93 % (05/21 0620)  Intake/Output from previous day: 05/20 0701 - 05/21 0700 In: 2712.1 [P.O.:360; I.V.:2352.1] Out: 600 [Urine:200; Blood:400] Intake/Output this shift: Total I/O In: 120 [P.O.:120] Out: -   Exam:  Sensation intact distally Dorsiflexion/Plantar flexion intact  Labs:  Recent Labs  01/01/15 0409  HGB 10.4*    Recent Labs  01/01/15 0409  WBC 11.7*  RBC 3.60*  HCT 31.3*  PLT 164    Recent Labs  01/01/15 0409  NA 135  K 3.9  CL 100*  CO2 28  BUN 17  CREATININE 1.07*  GLUCOSE 172*  CALCIUM 8.7*   No results for input(s): LABPT, INR in the last 72 hours.  Assessment/Plan: Patient doing well following hip surgery she was up walking in the room yesterday probable discharge tomorrow   Diarra Ceja SCOTT 01/01/2015, 9:06 AM

## 2015-01-01 NOTE — Progress Notes (Signed)
Physical Therapy Treatment Patient Details Name: Brandy Scott MRN: [id number] DOB: Jun 27, 1951 Today's Date: 01/01/2015    History of Present Illness Patient is a 64 y/o female s/p L THA, direct anterior approach. PMH of HTN, DM, COPD, SVT, anxiety and asthma.    PT Comments    Good improvement with ambulation, mostly at supervision level.  Min assist needed to rise to stand and to control descent to recliner.  Was not ready to practice steps this am.    Follow Up Recommendations  Home health PT;Supervision/Assistance - 24 hour     Equipment Recommendations  None recommended by PT    Recommendations for Other Services       Precautions / Restrictions Precautions Precautions: Fall Precaution Comments: Direct anterior approach. Restrictions Weight Bearing Restrictions: Yes LLE Weight Bearing: Weight bearing as tolerated    Mobility  Bed Mobility Overal bed mobility:  (pt. presented in recliner chair)                Transfers Overall transfer level: Needs assistance Equipment used: Rolling walker (2 wheeled) Transfers: Sit to/from Stand Sit to Stand: Min assist         General transfer comment: min assist to power up from recliner chair and to control descent   Ambulation/Gait Ambulation/Gait assistance: Supervision Ambulation Distance (Feet): 120 Feet Assistive device: Rolling walker (2 wheeled) Gait Pattern/deviations: Step-through pattern;Decreased stride length Gait velocity: decreased   General Gait Details: Pt. with increased soreness in advancing L LE but could complete withour assist or facilitation.  Safe usage of RW with no overt LOB noted.     Stairs            Wheelchair Mobility    Modified Rankin (Stroke Patients Only)       Balance                                    Cognition Arousal/Alertness: Awake/alert Behavior During Therapy: WFL for tasks assessed/performed Overall Cognitive Status: Within Functional  Limits for tasks assessed                      Exercises Total Joint Exercises Ankle Circles/Pumps: AROM;Both;10 reps Quad Sets: AROM;Both;10 reps Hip ABduction/ADduction: AAROM;Left;10 reps;Seated (in recliner) Long Arc Quad: AROM;Left;10 reps;Seated    General Comments        Pertinent Vitals/Pain Pain Assessment: 0-10 Pain Score: 3  Pain Location: L hip Pain Descriptors / Indicators: Sore (pt. says "it doesn't hurt, it just very sore") Pain Intervention(s): Limited activity within patient's tolerance;Monitored during session;Repositioned    Home Living                      Prior Function            PT Goals (current goals can now be found in the care plan section) Progress towards PT goals: Progressing toward goals    Frequency  7X/week    PT Plan Current plan remains appropriate    Co-evaluation             End of Session Equipment Utilized During Treatment: Gait belt Activity Tolerance: Patient tolerated treatment well Patient left: in chair;with call bell/phone within reach;with family/visitor present     Time: 1478-2956 PT Time Calculation (min) (ACUTE ONLY): 17 min  Charges:  $Gait Training: 8-22 mins  G CodesFerman Hamming:      Lawrence Mitch B 01/01/2015, 12:13 PM Weldon PickingSusan Aleya Durnell PT Acute Rehab Services [phone] [address] [phone]

## 2015-01-01 NOTE — Progress Notes (Signed)
OT Cancellation Note  Patient Details Name: Brandy Scott MRN: [id number] DOB: 19-May-1951   Cancelled Treatment:    Reason Eval/Treat Not Completed: OT screened. Spoke with pt and she has no OT concerns. Will sign off.  Earlie RavelingStraub, Jannelle Notaro L OTR/L [phone] 01/01/2015, 9:29 AM

## 2015-01-01 NOTE — Discharge Summary (Signed)
Physician Discharge Summary      Patient ID: Brandy Scott MRN: [id number] DOB/AGE: Oct 09, 1950 64 y.o.  Admit date: 12/31/2014 Discharge date: 01/02/2015  Admission Diagnoses:  Left hip osteoarthritis  Discharge Diagnoses:  Active Problems:   Hip arthritis   Past Medical History  Diagnosis Date  . Hypercholesteremia   . HTN (hypertension)   . Diabetes mellitus   . Gout   . COPD (chronic obstructive pulmonary disease)   . SVT (supraventricular tachycardia)   . Anxiety   . Shortness of breath   . Asthma   . Arthritis     Surgeries: Procedure(s): LEFT TOTAL HIP ARTHROPLASTY ANTERIOR APPROACH on 12/31/2014   Consultants (if any):    Discharged Condition: Improved  Hospital Course: Brandy Scott is an 64 y.o. female who was admitted 12/31/2014 with a diagnosis of left hip osteoarthritis and went to the operating room on 12/31/2014 and underwent the above named procedures.    She was given perioperative antibiotics:      Anti-infectives    Start     Dose/Rate Route Frequency Ordered Stop   12/31/14 1730  vancomycin (VANCOCIN) IVPB 1000 mg/200 mL premix     1,000 mg 200 mL/hr over 60 Minutes Intravenous Every 12 hours 12/31/14 1335 12/31/14 2001   12/31/14 0715  vancomycin (VANCOCIN) 1,500 mg in sodium chloride 0.9 % 500 mL IVPB     1,500 mg 250 mL/hr over 120 Minutes Intravenous To ShortStay Surgical 12/30/14 1326 12/31/14 0921    .  She was given sequential compression devices, early ambulation, and aspirin for DVT prophylaxis.  She benefited maximally from the hospital stay and there were no complications.    Recent vital signs:  Filed Vitals:   01/02/15 0455  BP: 121/53  Pulse: 93  Temp: 99.8 F (37.7 C)  Resp: 18    Recent laboratory studies:  Lab Results  Component Value Date   HGB 9.9* 01/02/2015   HGB 10.4* 01/01/2015   HGB 13.6 12/21/2014   Lab Results  Component Value Date   WBC 11.0* 01/02/2015   PLT 195 01/02/2015   Lab Results    Component Value Date   INR 1.00 12/21/2014   Lab Results  Component Value Date   NA 135 01/01/2015   K 3.9 01/01/2015   CL 100* 01/01/2015   CO2 28 01/01/2015   BUN 17 01/01/2015   CREATININE 1.07* 01/01/2015   GLUCOSE 172* 01/01/2015    Discharge Medications:     Medication List    STOP taking these medications        aspirin 81 MG tablet  Replaced by:  aspirin EC 325 MG tablet      TAKE these medications        albuterol 108 (90 BASE) MCG/ACT inhaler  Commonly known as:  PROVENTIL HFA;VENTOLIN HFA  Inhale 2 puffs into the lungs every 6 (six) hours as needed for wheezing or shortness of breath.     aspirin EC 325 MG tablet  Take 1 tablet (325 mg total) by mouth 2 (two) times daily.     docusate sodium 100 MG capsule  Commonly known as:  COLACE  Take 100 mg by mouth daily as needed for mild constipation.     fenofibrate 145 MG tablet  Commonly known as:  TRICOR  Take 145 mg by mouth daily.     FLUTTER Devi  Use 3-4 times daily     losartan-hydrochlorothiazide 100-25 MG per tablet  Commonly known as:  HYZAAR  Take 1 tablet by mouth daily.     metFORMIN 1000 MG tablet  Commonly known as:  GLUCOPHAGE  Take 1,000 mg by mouth 2 (two) times daily with a meal.     mometasone-formoterol 200-5 MCG/ACT Aero  Commonly known as:  DULERA  Inhale 2 puffs into the lungs 2 (two) times daily.     oxyCODONE 5 MG immediate release tablet  Commonly known as:  Oxy IR/ROXICODONE  Take 1-3 tablets (5-15 mg total) by mouth every 4 (four) hours as needed.     OxyCODONE 10 mg T12a 12 hr tablet  Commonly known as:  OXYCONTIN  Take 1 tablet (10 mg total) by mouth every 12 (twelve) hours.     predniSONE 10 MG tablet  Commonly known as:  DELTASONE  Take 4 for three days 3 for three days 2 for three days 1 for three days and stop        Diagnostic Studies: Dg Pelvis Portable  12/31/2014   CLINICAL DATA:  64 year old female status post left hip replacement. Initial  encounter.  EXAM: PORTABLE PELVIS 1-2 VIEWS  COMPARISON:  07/16/2013.  FINDINGS: Portable AP supine view of the pelvis at 1121 hours. Preexisting right total hip arthroplasty. New left total hip arthroplasty hardware in place. AP alignment is normal. Hardware appears intact. No unexpected osseous changes.  IMPRESSION: Left total hip arthroplasty, no adverse features.   Electronically Signed   By: Odessa Fleming M.D.   On: 12/31/2014 12:35   Dg Hip Operative Unilat With Pelvis Left  12/31/2014   CLINICAL DATA:  64 year old female with a history of left hip replacement  EXAM: OPERATIVE LEFT HIP (WITH PELVIS IF PERFORMED) 2 VIEWS  TECHNIQUE: Fluoroscopic spot image(s) were submitted for interpretation post-operatively.  FLUOROSCOPY TIME:  Radiation Exposure Index (as provided by the fluoroscopic device): Op note  If the device does not provide the exposure index:  Fluoroscopy Time:  1 min 22 seconds  Number of Acquired Images:  2  COMPARISON:  None.  FINDINGS: Limited intraoperative fluoroscopic spot images of the left hip.  Surgical changes of total left hip arthroplasty. No complicating features.  IMPRESSION: Limited intraoperative fluoroscopic spot images of the left hip demonstrate surgical changes of left hip total arthroplasty. No complicating features.  Please refer to the dictated operative report for full details of intraoperative findings and procedure.  Signed,  Yvone Neu. Loreta Ave, DO  [facility]  [facility]   Electronically Signed   By: Gilmer Mor D.O.   On: 12/31/2014 10:49    Disposition: 06-Home-Health Care Svc  Discharge Instructions    Call MD / Call 911    Complete by:  As directed   If you experience chest pain or shortness of breath, CALL 911 and be transported to the hospital emergency room.  If you develope a fever above 101.5 F, pus (white drainage) or increased drainage or redness at the wound, or calf pain, call your surgeon's office.      Constipation Prevention    Complete by:  As directed   Drink plenty of fluids.  Prune juice may be helpful.  You may use a stool softener, such as Colace (over the counter) 100 mg twice a day.  Use MiraLax (over the counter) for constipation as needed.     Diet - low sodium heart healthy    Complete by:  As directed      Diet general    Complete by:  As directed  Driving restrictions    Complete by:  As directed   No driving while taking narcotic pain meds.     Increase activity slowly as tolerated    Complete by:  As directed      Weight bearing as tolerated    Complete by:  As directed            Follow-up Information    Follow up with Brandy Scott, Svetlana Bagby Michael, MD In 2 weeks.   Specialty:  Orthopedic Surgery   Why:  For suture removal, For wound re-check   Contact information:   [address] [place] [id number] [phone]        Signed: Cheral Scott, Brandy Scott 01/02/2015, 9:01 AM

## 2015-01-02 LAB — CBC
HCT: 30.4 % — ABNORMAL LOW (ref 36.0–46.0)
Hemoglobin: 9.9 g/dL — ABNORMAL LOW (ref 12.0–15.0)
MCH: 28.2 pg (ref 26.0–34.0)
MCHC: 32.6 g/dL (ref 30.0–36.0)
MCV: 86.6 fL (ref 78.0–100.0)
Platelets: 195 10*3/uL (ref 150–400)
RBC: 3.51 MIL/uL — ABNORMAL LOW (ref 3.87–5.11)
RDW: 13.4 % (ref 11.5–15.5)
WBC: 11 10*3/uL — ABNORMAL HIGH (ref 4.0–10.5)

## 2015-01-02 LAB — GLUCOSE, CAPILLARY: Glucose-Capillary: 157 mg/dL — ABNORMAL HIGH (ref 65–99)

## 2015-01-02 NOTE — Progress Notes (Signed)
Patient stable Walking in halls Pain controlled Ready for discharge

## 2015-01-02 NOTE — Progress Notes (Signed)
Physical Therapy Treatment Patient Details Name: Brandy Scott MRN: [id number] DOB: 08-Aug-1951 Today's Date: 01/02/2015    History of Present Illness Patient is a 64 y/o female s/p L THA, direct anterior approach. PMH of HTN, DM, COPD, SVT, anxiety and asthma.    PT Comments    Stair training complete. Pt to d/c home today.  Follow Up Recommendations  Home health PT;Supervision/Assistance - 24 hour     Equipment Recommendations  None recommended by PT    Recommendations for Other Services       Precautions / Restrictions Precautions Precautions: Fall Precaution Comments: Direct anterior approach. Restrictions LLE Weight Bearing: Weight bearing as tolerated    Mobility  Bed Mobility               General bed mobility comments: Pt up in recliner upon arrival.  Transfers   Equipment used: Rolling walker (2 wheeled)   Sit to Stand: Min guard            Ambulation/Gait Ambulation/Gait assistance: Supervision Ambulation Distance (Feet): 150 Feet Assistive device: Rolling walker (2 wheeled) Gait Pattern/deviations: Step-through pattern;Decreased stride length;Antalgic Gait velocity: decreased Gait velocity interpretation: Below normal speed for age/gender     Stairs Stairs: Yes Stairs assistance: Min guard Stair Management: One rail Left;Step to pattern;Sideways Number of Stairs: 5    Wheelchair Mobility    Modified Rankin (Stroke Patients Only)       Balance                                    Cognition Arousal/Alertness: Awake/alert Behavior During Therapy: WFL for tasks assessed/performed Overall Cognitive Status: Within Functional Limits for tasks assessed                      Exercises      General Comments        Pertinent Vitals/Pain Pain Assessment: 0-10 Pain Score: 3  Pain Location: L hip Pain Descriptors / Indicators: Sore Pain Intervention(s): Monitored during session    Home Living                       Prior Function            PT Goals (current goals can now be found in the care plan section) Acute Rehab PT Goals Patient Stated Goal: to return home PT Goal Formulation: With patient Time For Goal Achievement: 01/14/15 Potential to Achieve Goals: Good Progress towards PT goals: Progressing toward goals    Frequency  7X/week    PT Plan Current plan remains appropriate    Co-evaluation             End of Session Equipment Utilized During Treatment: Gait belt Activity Tolerance: Patient tolerated treatment well Patient left: in chair;with call bell/phone within reach     Time: 0830-0900 PT Time Calculation (min) (ACUTE ONLY): 30 min  Charges:  $Gait Training: 23-37 mins                    G Codes:      Ilda FoilGarrow, Brandy Scott 01/02/2015, 9:35 AM

## 2015-01-02 NOTE — Progress Notes (Signed)
Noted order to DC patient.DC instructions given and patient verbalized understanding.Note [facility] already [facility] by case manager. Information provided to patient.

## 2015-01-03 ENCOUNTER — Encounter (HOSPITAL_COMMUNITY): Payer: Self-pay | Admitting: Orthopaedic Surgery

## 2015-01-05 ENCOUNTER — Ambulatory Visit: Payer: Medicare Other | Admitting: Critical Care Medicine

## 2015-06-10 ENCOUNTER — Other Ambulatory Visit (HOSPITAL_BASED_OUTPATIENT_CLINIC_OR_DEPARTMENT_OTHER): Payer: Self-pay | Admitting: Family Medicine

## 2015-06-10 DIAGNOSIS — Z1231 Encounter for screening mammogram for malignant neoplasm of breast: Secondary | ICD-10-CM

## 2015-06-16 ENCOUNTER — Ambulatory Visit (HOSPITAL_BASED_OUTPATIENT_CLINIC_OR_DEPARTMENT_OTHER)
Admission: RE | Admit: 2015-06-16 | Discharge: 2015-06-16 | Disposition: A | Discharge status: Home / Self Care | Payer: Medicare Other | Source: Ambulatory Visit | Attending: Family Medicine | Admitting: Family Medicine

## 2015-06-16 DIAGNOSIS — Z1231 Encounter for screening mammogram for malignant neoplasm of breast: Secondary | ICD-10-CM | POA: Diagnosis present

## 2015-06-16 DIAGNOSIS — R928 Other abnormal and inconclusive findings on diagnostic imaging of breast: Secondary | ICD-10-CM | POA: Diagnosis not present

## 2015-06-22 ENCOUNTER — Other Ambulatory Visit: Payer: Self-pay | Admitting: Family Medicine

## 2015-06-22 DIAGNOSIS — R928 Other abnormal and inconclusive findings on diagnostic imaging of breast: Secondary | ICD-10-CM

## 2015-06-28 ENCOUNTER — Ambulatory Visit
Admission: RE | Admit: 2015-06-28 | Discharge: 2015-06-28 | Disposition: A | Discharge status: Home / Self Care | Payer: Medicare Other | Source: Ambulatory Visit | Attending: Family Medicine | Admitting: Family Medicine

## 2015-06-28 DIAGNOSIS — R928 Other abnormal and inconclusive findings on diagnostic imaging of breast: Secondary | ICD-10-CM

## 2015-07-13 ENCOUNTER — Other Ambulatory Visit: Payer: Self-pay | Admitting: Critical Care Medicine

## 2015-07-13 DIAGNOSIS — R0600 Dyspnea, unspecified: Secondary | ICD-10-CM

## 2015-07-14 ENCOUNTER — Encounter (INDEPENDENT_AMBULATORY_CARE_PROVIDER_SITE_OTHER): Payer: Medicare Other | Admitting: Pulmonary Disease

## 2015-07-14 ENCOUNTER — Encounter: Payer: Self-pay | Admitting: Pulmonary Disease

## 2015-07-14 ENCOUNTER — Ambulatory Visit (INDEPENDENT_AMBULATORY_CARE_PROVIDER_SITE_OTHER): Payer: Medicare Other | Admitting: Pulmonary Disease

## 2015-07-14 VITALS — BP 138/74 | HR 94 | Temp 98.4°F | Ht 67.0 in | Wt 245.0 lb

## 2015-07-14 DIAGNOSIS — J449 Chronic obstructive pulmonary disease, unspecified: Secondary | ICD-10-CM | POA: Diagnosis not present

## 2015-07-14 DIAGNOSIS — R0600 Dyspnea, unspecified: Secondary | ICD-10-CM

## 2015-07-14 DIAGNOSIS — R0689 Other abnormalities of breathing: Secondary | ICD-10-CM | POA: Diagnosis not present

## 2015-07-14 LAB — PULMONARY FUNCTION TEST
DL/VA % pred: 118 %
DL/VA: 6.08 ml/min/mmHg/L
DLCO unc % pred: 104 %
DLCO unc: 29.7 ml/min/mmHg
FEF 25-75 Post: 2.27 L/sec
FEF 25-75 Pre: 1.52 L/sec
FEF2575-%Change-Post: 48 %
FEF2575-%Pred-Post: 96 %
FEF2575-%Pred-Pre: 64 %
FEV1-%Change-Post: 8 %
FEV1-%Pred-Post: 77 %
FEV1-%Pred-Pre: 71 %
FEV1-Post: 2.14 L
FEV1-Pre: 1.96 L
FEV1FVC-%Change-Post: 2 %
FEV1FVC-%Pred-Pre: 98 %
FEV6-%Change-Post: 7 %
FEV6-%Pred-Post: 79 %
FEV6-%Pred-Pre: 74 %
FEV6-Post: 2.75 L
FEV6-Pre: 2.56 L
FEV6FVC-%Change-Post: 0 %
FEV6FVC-%Pred-Post: 104 %
FEV6FVC-%Pred-Pre: 103 %
FVC-%Change-Post: 6 %
FVC-%Pred-Post: 76 %
FVC-%Pred-Pre: 72 %
FVC-Post: 2.75 L
FVC-Pre: 2.58 L
Post FEV1/FVC ratio: 78 %
Post FEV6/FVC ratio: 100 %
Pre FEV1/FVC ratio: 76 %
Pre FEV6/FVC Ratio: 99 %
RV % pred: 131 %
RV: 2.92 L
TLC % pred: 100 %
TLC: 5.54 L

## 2015-07-14 NOTE — Progress Notes (Addendum)
Subjective:    Patient ID: Brandy Scott, female    DOB: [birth year], 64 y.o.   MRN: [id number]  HPI Follow-up for evaluation of dyspnea.   Brandy Scott is a 64 year old with prior diagnosis of COPD and asthma. She is a former patient of Dr. Delford Field. She has been evaluated in the past with CPET at which was suggestive of dyspnea secondary to deconditioning, possible asthma. She also has a diagnosis of COPD even though her PFTs did not show any obstruction. She has a significant smoking history of 40 pack years. She quit in 2012. She has been tried on multiple inhalers including Advair, Spiriva, Dulera and also Singulair. But she did not tolerate any of the secondary to various reasons. She's had 2 hip replacements in 2014 2016 under [facility]. She did not have any respiratory complications or perioperative issues after this.  Her main symptoms in the office today are dyspnea on exertion. She has occasional cough with wheezing. She also has PFTs today which did not show any significant abnormality. She does not have any snoring or history of daytime sleepiness.  DATA: CPET (04/05/11) Exercise testing with gas exchange demonstrates a normal functional capacity when compared to matched sedentary norms. There is evidence to suggest her obesity is contributing to her overall exercise intolerance. However, the significant EIB response in post-exercise spirometry is the major contributor for her to maintain sustained exercise. In summary, dyspnea due to obesity but also strongly positive for asthma. In addition, at rest she had diffuse T wave inversion and had hypertensive response. I think she obesity, asthma, hypertensive response and ? Occult CAD are contributing to dyspnea.  PFTs [07/14/15] FVC 2.58 [72%] FEV1 1.96 (71%] FEF 25-75% 1.52 [64%] F/F 76 TLC 100% RV/TLC 128 DLCO 104%   Past Medical History  Diagnosis Date  . Hypercholesteremia   . HTN (hypertension)   . Diabetes mellitus   .  Gout   . COPD (chronic obstructive pulmonary disease) (HCC)   . SVT (supraventricular tachycardia) (HCC)   . Anxiety   . Shortness of breath   . Asthma   . Arthritis     Current outpatient prescriptions:  .  albuterol (PROVENTIL HFA;VENTOLIN HFA) 108 (90 BASE) MCG/ACT inhaler, Inhale 2 puffs into the lungs every 6 (six) hours as needed for wheezing or shortness of breath., Disp: 1 Inhaler, Rfl: 6 .  aspirin 81 MG tablet, Take 81 mg by mouth daily., Disp: , Rfl:  .  docusate sodium (COLACE) 100 MG capsule, Take 100 mg by mouth daily as needed for mild constipation., Disp: , Rfl:  .  fenofibrate (TRICOR) 145 MG tablet, Take 145 mg by mouth daily. , Disp: , Rfl: 1 .  losartan-hydrochlorothiazide (HYZAAR) 100-25 MG per tablet, Take 1 tablet by mouth daily., Disp: , Rfl:  .  metFORMIN (GLUCOPHAGE) 1000 MG tablet, Take 1,000 mg by mouth 2 (two) times daily with a meal. , Disp: , Rfl:  .  Respiratory Therapy Supplies (FLUTTER) DEVI, Use 3-4 times daily, Disp: 1 each, Rfl: 0 .  mometasone-formoterol (DULERA) 200-5 MCG/ACT AERO, Inhale 2 puffs into the lungs 2 (two) times daily. (Patient not taking: Reported on 07/14/2015), Disp: 13 g, Rfl: 11  Review of Systems Dyspnea on exertion, cough with wheezing, no sputum production, hemoptysis No chest pain, palpitations. No nausea, vomiting, diarrhea, constipation. No fevers, chills, loss of weight, appetite. All other review of systems are negative    Objective:   Physical Exam  Blood pressure 138/74, pulse 94,  temperature 98.4 F (36.9 C), temperature source Oral, height 5\' 7"  (1.702 m), weight 245 lb (111.131 kg), SpO2 95 %.  Gen: No apparent distress Neuro: No gross focal deficits. Neck: No JVD, lymphadenopathy, thyromegaly. RS: clear no wheeze or crackles: S1-S2 heard, no murmurs rubs gallops. Abdomen: Soft, positive bowel sounds. Extremities: No edema.    Assessment & Plan:  Dyspnea on exertion  She has a diagnosis of asthma.She also  has a heavy smoking history and probably emphysema although she does not have overt obstruction on her PFTs. She does have reduction in the mid flow range just shows small airway obstruction and increase in RV/TLC ratio suggestive of airtrapping. But has not really tolerated any of the inhalers in the past. We'll observe her off inhalers for now.   I suspect the majority of her symptoms are secondary to deconditioning and obesity. She is trying to lose weight without success. I have advised her to go on a diet and exercise program.  Chilton GreathousePraveen Zhara Gieske MD [facility] Pager [phone] If no answer or after 3pm call: [phone] 07/14/2015, 5:29 PM

## 2015-07-14 NOTE — Patient Instructions (Signed)
Continue using albuterol when necessary. Stop Dulera. Advised to lose weight and start an exercise program Return to clinic in 6 months

## 2015-11-14 DIAGNOSIS — H1045 Other chronic allergic conjunctivitis: Secondary | ICD-10-CM | POA: Diagnosis not present

## 2015-11-17 DIAGNOSIS — M545 Low back pain: Secondary | ICD-10-CM | POA: Diagnosis not present

## 2015-12-07 DIAGNOSIS — H52223 Regular astigmatism, bilateral: Secondary | ICD-10-CM | POA: Diagnosis not present

## 2015-12-14 DIAGNOSIS — E119 Type 2 diabetes mellitus without complications: Secondary | ICD-10-CM | POA: Diagnosis not present

## 2015-12-14 DIAGNOSIS — H40013 Open angle with borderline findings, low risk, bilateral: Secondary | ICD-10-CM | POA: Diagnosis not present

## 2015-12-14 DIAGNOSIS — H02834 Dermatochalasis of left upper eyelid: Secondary | ICD-10-CM | POA: Diagnosis not present

## 2015-12-14 DIAGNOSIS — H02831 Dermatochalasis of right upper eyelid: Secondary | ICD-10-CM | POA: Diagnosis not present

## 2015-12-14 DIAGNOSIS — H2513 Age-related nuclear cataract, bilateral: Secondary | ICD-10-CM | POA: Diagnosis not present

## 2016-01-10 DIAGNOSIS — J441 Chronic obstructive pulmonary disease with (acute) exacerbation: Secondary | ICD-10-CM | POA: Diagnosis not present

## 2016-01-25 DIAGNOSIS — E1140 Type 2 diabetes mellitus with diabetic neuropathy, unspecified: Secondary | ICD-10-CM | POA: Diagnosis not present

## 2016-01-25 DIAGNOSIS — E1121 Type 2 diabetes mellitus with diabetic nephropathy: Secondary | ICD-10-CM | POA: Diagnosis not present

## 2016-01-25 DIAGNOSIS — N182 Chronic kidney disease, stage 2 (mild): Secondary | ICD-10-CM | POA: Diagnosis not present

## 2016-01-25 DIAGNOSIS — R05 Cough: Secondary | ICD-10-CM | POA: Diagnosis not present

## 2016-01-25 DIAGNOSIS — E785 Hyperlipidemia, unspecified: Secondary | ICD-10-CM | POA: Diagnosis not present

## 2016-01-25 DIAGNOSIS — E1165 Type 2 diabetes mellitus with hyperglycemia: Secondary | ICD-10-CM | POA: Diagnosis not present

## 2016-01-25 DIAGNOSIS — I129 Hypertensive chronic kidney disease with stage 1 through stage 4 chronic kidney disease, or unspecified chronic kidney disease: Secondary | ICD-10-CM | POA: Diagnosis not present

## 2016-01-31 DIAGNOSIS — M25552 Pain in left hip: Secondary | ICD-10-CM | POA: Diagnosis not present

## 2016-04-26 DIAGNOSIS — Z23 Encounter for immunization: Secondary | ICD-10-CM | POA: Diagnosis not present

## 2016-04-26 DIAGNOSIS — I129 Hypertensive chronic kidney disease with stage 1 through stage 4 chronic kidney disease, or unspecified chronic kidney disease: Secondary | ICD-10-CM | POA: Diagnosis not present

## 2016-04-26 DIAGNOSIS — E785 Hyperlipidemia, unspecified: Secondary | ICD-10-CM | POA: Diagnosis not present

## 2016-04-26 DIAGNOSIS — J449 Chronic obstructive pulmonary disease, unspecified: Secondary | ICD-10-CM | POA: Diagnosis not present

## 2016-04-26 DIAGNOSIS — N182 Chronic kidney disease, stage 2 (mild): Secondary | ICD-10-CM | POA: Diagnosis not present

## 2016-04-26 DIAGNOSIS — E1121 Type 2 diabetes mellitus with diabetic nephropathy: Secondary | ICD-10-CM | POA: Diagnosis not present

## 2016-04-26 DIAGNOSIS — E1140 Type 2 diabetes mellitus with diabetic neuropathy, unspecified: Secondary | ICD-10-CM | POA: Diagnosis not present

## 2016-06-20 ENCOUNTER — Other Ambulatory Visit: Payer: Self-pay | Admitting: Family Medicine

## 2016-06-20 DIAGNOSIS — Z1231 Encounter for screening mammogram for malignant neoplasm of breast: Secondary | ICD-10-CM

## 2016-07-23 ENCOUNTER — Ambulatory Visit
Admission: RE | Admit: 2016-07-23 | Discharge: 2016-07-23 | Disposition: A | Discharge status: Home / Self Care | Payer: Medicare Other | Source: Ambulatory Visit | Attending: Family Medicine | Admitting: Family Medicine

## 2016-07-23 DIAGNOSIS — Z1231 Encounter for screening mammogram for malignant neoplasm of breast: Secondary | ICD-10-CM

## 2016-07-27 DIAGNOSIS — N182 Chronic kidney disease, stage 2 (mild): Secondary | ICD-10-CM | POA: Diagnosis not present

## 2016-07-27 DIAGNOSIS — I129 Hypertensive chronic kidney disease with stage 1 through stage 4 chronic kidney disease, or unspecified chronic kidney disease: Secondary | ICD-10-CM | POA: Diagnosis not present

## 2016-07-27 DIAGNOSIS — E785 Hyperlipidemia, unspecified: Secondary | ICD-10-CM | POA: Diagnosis not present

## 2016-07-27 DIAGNOSIS — E1140 Type 2 diabetes mellitus with diabetic neuropathy, unspecified: Secondary | ICD-10-CM | POA: Diagnosis not present

## 2016-07-27 DIAGNOSIS — Z7984 Long term (current) use of oral hypoglycemic drugs: Secondary | ICD-10-CM | POA: Diagnosis not present

## 2016-07-27 DIAGNOSIS — K640 First degree hemorrhoids: Secondary | ICD-10-CM | POA: Diagnosis not present

## 2016-07-27 DIAGNOSIS — E1121 Type 2 diabetes mellitus with diabetic nephropathy: Secondary | ICD-10-CM | POA: Diagnosis not present

## 2016-09-11 DIAGNOSIS — K644 Residual hemorrhoidal skin tags: Secondary | ICD-10-CM | POA: Diagnosis not present

## 2016-10-26 DIAGNOSIS — E1121 Type 2 diabetes mellitus with diabetic nephropathy: Secondary | ICD-10-CM | POA: Diagnosis not present

## 2016-10-26 DIAGNOSIS — I129 Hypertensive chronic kidney disease with stage 1 through stage 4 chronic kidney disease, or unspecified chronic kidney disease: Secondary | ICD-10-CM | POA: Diagnosis not present

## 2016-10-26 DIAGNOSIS — N182 Chronic kidney disease, stage 2 (mild): Secondary | ICD-10-CM | POA: Diagnosis not present

## 2016-10-26 DIAGNOSIS — E1140 Type 2 diabetes mellitus with diabetic neuropathy, unspecified: Secondary | ICD-10-CM | POA: Diagnosis not present

## 2016-11-06 DIAGNOSIS — K644 Residual hemorrhoidal skin tags: Secondary | ICD-10-CM | POA: Diagnosis not present

## 2017-01-29 ENCOUNTER — Ambulatory Visit (INDEPENDENT_AMBULATORY_CARE_PROVIDER_SITE_OTHER): Payer: Self-pay | Admitting: Orthopaedic Surgery

## 2017-01-29 DIAGNOSIS — E1121 Type 2 diabetes mellitus with diabetic nephropathy: Secondary | ICD-10-CM | POA: Diagnosis not present

## 2017-01-29 DIAGNOSIS — I129 Hypertensive chronic kidney disease with stage 1 through stage 4 chronic kidney disease, or unspecified chronic kidney disease: Secondary | ICD-10-CM | POA: Diagnosis not present

## 2017-01-29 DIAGNOSIS — E1140 Type 2 diabetes mellitus with diabetic neuropathy, unspecified: Secondary | ICD-10-CM | POA: Diagnosis not present

## 2017-01-29 DIAGNOSIS — N182 Chronic kidney disease, stage 2 (mild): Secondary | ICD-10-CM | POA: Diagnosis not present

## 2017-01-31 ENCOUNTER — Ambulatory Visit (INDEPENDENT_AMBULATORY_CARE_PROVIDER_SITE_OTHER): Payer: Self-pay

## 2017-01-31 ENCOUNTER — Ambulatory Visit (INDEPENDENT_AMBULATORY_CARE_PROVIDER_SITE_OTHER): Payer: Medicare Other | Admitting: Orthopaedic Surgery

## 2017-01-31 DIAGNOSIS — M7062 Trochanteric bursitis, left hip: Secondary | ICD-10-CM

## 2017-01-31 MED ORDER — PREDNISONE 10 MG (21) PO TBPK
ORAL_TABLET | ORAL | 0 refills | Status: DC
Start: 2017-01-31 — End: 2021-09-18

## 2017-01-31 NOTE — Progress Notes (Signed)
Office Visit Note   Patient: Brandy Scott           Date of Birth: 06/10/1951           MRN: [id number] Visit Date: 01/31/2017              Requested by: Laurann Montana, MD [phone] [address] [place], [place] [address] PCP: Laurann Montana, MD   Assessment & Plan: Visit Diagnoses:  1. Greater trochanteric bursitis, left     Plan: Overall impression is left trochanteric bursitis. Patient declined injection. Prednisone taper was prescribed. Can consider physical therapy if not better.  Follow-Up Instructions: Return in about 3 years (around 02/01/2020), or if symptoms worsen or fail to improve.   Orders:  Orders Placed This Encounter  Procedures  . XR HIP UNILAT W OR W/O PELVIS 2-3 VIEWS LEFT   Meds ordered this encounter  Medications  . predniSONE (STERAPRED UNI-PAK 21 TAB) 10 MG (21) TBPK tablet    Sig: Take as directed    Dispense:  21 tablet    Refill:  0      Procedures: No procedures performed   Clinical Data: No additional findings.   Subjective: No chief complaint on file.   Brandy Scott is coming in today for left lateral hip pain for several weeks. She sleeps on her left side. She denies any groin pain or radiation of pain. She denies any injuries. She is 2 years status post left total hip replacement 4 years status post right total replacement.  She's been doing well overall. Denies any radiation of pain.    Review of Systems  Constitutional: Negative.   HENT: Negative.   Eyes: Negative.   Respiratory: Negative.   Cardiovascular: Negative.   Endocrine: Negative.   Musculoskeletal: Negative.   Neurological: Negative.   Hematological: Negative.   Psychiatric/Behavioral: Negative.   All other systems reviewed and are negative.    Objective: Vital Signs: There were no vitals taken for this visit.  Physical Exam  Constitutional: She is oriented to person, place, and time. She appears well-developed and well-nourished.  HENT:  Head:  Normocephalic and atraumatic.  Eyes: EOM are normal.  Neck: Neck supple.  Pulmonary/Chest: Effort normal.  Abdominal: Soft.  Neurological: She is alert and oriented to person, place, and time.  Skin: Skin is warm. Capillary refill takes less than 2 seconds.  Psychiatric: She has a normal mood and affect. Her behavior is normal. Judgment and thought content normal.  Nursing note and vitals reviewed.   Ortho Exam Left hip exam shows well-healed postsurgical scars. Lateral hip is tender to palpation. No other focal findings. Specialty Comments:  No specialty comments available.  Imaging: Xr Hip Unilat W Or W/o Pelvis 2-3 Views Left  Result Date: 01/31/2017 Stable total hip replacements.    PMFS History: Patient Active Problem List   Diagnosis Date Noted  . Hip arthritis 12/31/2014  . Osteoarthritis of right hip 07/16/2013  . Chronic right hip pain 04/02/2013  . SVT (supraventricular tachycardia) (HCC) 05/23/2011  . Chest pain 05/02/2011  . Palpitations 05/02/2011  . COPD, moderate, Gold B 03/15/2011   Past Medical History:  Diagnosis Date  . Anxiety   . Arthritis   . Asthma   . COPD (chronic obstructive pulmonary disease) (HCC)   . Diabetes mellitus   . Gout   . HTN (hypertension)   . Hypercholesteremia   . Shortness of breath   . SVT (supraventricular tachycardia) (HCC)     Family  History  Problem Relation Age of Onset  . Hypertension Father   . Diabetes Father   . Parkinsonism Father   . Colon cancer Mother   . Alzheimer's disease Mother   . Clotting disorder Mother        had filter placed    Past Surgical History:  Procedure Laterality Date  . CARDIAC CATHETERIZATION     2010      clean  . DILATION AND CURETTAGE OF UTERUS    . HERNIA REPAIR    . HYSTEROSCOPY    . TOTAL HIP ARTHROPLASTY Right 07/16/2013   Procedure: RIGHT TOTAL HIP ARTHROPLASTY ANTERIOR APPROACH;  Surgeon: Cheral AlmasNaiping Michael Xu, MD;  Location: [facility] OR;  Service: Orthopedics;  Laterality:  Right;  . TOTAL HIP ARTHROPLASTY Left 12/31/2014   Procedure: LEFT TOTAL HIP ARTHROPLASTY ANTERIOR APPROACH;  Surgeon: Tarry KosNaiping M Xu, MD;  Location: [facility] OR;  Service: Orthopedics;  Laterality: Left;  . TUBAL LIGATION     Social History   Occupational History  . unemployed    Social History Main Topics  . Smoking status: Former Smoker    Packs/day: 1.00    Years: 42.00    Types: Cigarettes    Quit date: 05/14/2010  . Smokeless tobacco: Never Used  . Alcohol use No  . Drug use: No  . Sexual activity: Yes

## 2017-03-13 DIAGNOSIS — R442 Other hallucinations: Secondary | ICD-10-CM | POA: Diagnosis not present

## 2017-05-01 DIAGNOSIS — Z23 Encounter for immunization: Secondary | ICD-10-CM | POA: Diagnosis not present

## 2017-05-01 DIAGNOSIS — E1121 Type 2 diabetes mellitus with diabetic nephropathy: Secondary | ICD-10-CM | POA: Diagnosis not present

## 2017-05-01 DIAGNOSIS — E1140 Type 2 diabetes mellitus with diabetic neuropathy, unspecified: Secondary | ICD-10-CM | POA: Diagnosis not present

## 2017-05-01 DIAGNOSIS — Z1389 Encounter for screening for other disorder: Secondary | ICD-10-CM | POA: Diagnosis not present

## 2017-05-23 DIAGNOSIS — J069 Acute upper respiratory infection, unspecified: Secondary | ICD-10-CM | POA: Diagnosis not present

## 2017-06-26 ENCOUNTER — Other Ambulatory Visit: Payer: Self-pay | Admitting: Family Medicine

## 2017-06-26 DIAGNOSIS — Z1231 Encounter for screening mammogram for malignant neoplasm of breast: Secondary | ICD-10-CM

## 2017-07-25 ENCOUNTER — Ambulatory Visit: Payer: Medicare Other

## 2017-08-22 ENCOUNTER — Ambulatory Visit
Admission: RE | Admit: 2017-08-22 | Discharge: 2017-08-22 | Disposition: A | Discharge status: Home / Self Care | Payer: Medicare Other | Source: Ambulatory Visit | Attending: Family Medicine | Admitting: Family Medicine

## 2017-08-22 DIAGNOSIS — Z1231 Encounter for screening mammogram for malignant neoplasm of breast: Secondary | ICD-10-CM | POA: Diagnosis not present

## 2017-09-12 DIAGNOSIS — I129 Hypertensive chronic kidney disease with stage 1 through stage 4 chronic kidney disease, or unspecified chronic kidney disease: Secondary | ICD-10-CM | POA: Diagnosis not present

## 2017-09-12 DIAGNOSIS — Z0000 Encounter for general adult medical examination without abnormal findings: Secondary | ICD-10-CM | POA: Diagnosis not present

## 2017-09-12 DIAGNOSIS — E1140 Type 2 diabetes mellitus with diabetic neuropathy, unspecified: Secondary | ICD-10-CM | POA: Diagnosis not present

## 2017-09-12 DIAGNOSIS — E1121 Type 2 diabetes mellitus with diabetic nephropathy: Secondary | ICD-10-CM | POA: Diagnosis not present

## 2017-09-13 ENCOUNTER — Ambulatory Visit
Admission: RE | Admit: 2017-09-13 | Discharge: 2017-09-13 | Disposition: A | Discharge status: Home / Self Care | Payer: Medicare Other | Source: Ambulatory Visit | Attending: Family Medicine | Admitting: Family Medicine

## 2017-09-13 ENCOUNTER — Other Ambulatory Visit: Payer: Self-pay | Admitting: Family Medicine

## 2017-09-13 DIAGNOSIS — R05 Cough: Secondary | ICD-10-CM | POA: Diagnosis not present

## 2017-09-13 DIAGNOSIS — R059 Cough, unspecified: Secondary | ICD-10-CM

## 2018-03-17 DIAGNOSIS — E1140 Type 2 diabetes mellitus with diabetic neuropathy, unspecified: Secondary | ICD-10-CM | POA: Diagnosis not present

## 2018-03-17 DIAGNOSIS — E1121 Type 2 diabetes mellitus with diabetic nephropathy: Secondary | ICD-10-CM | POA: Diagnosis not present

## 2018-03-17 DIAGNOSIS — I129 Hypertensive chronic kidney disease with stage 1 through stage 4 chronic kidney disease, or unspecified chronic kidney disease: Secondary | ICD-10-CM | POA: Diagnosis not present

## 2018-03-17 DIAGNOSIS — N182 Chronic kidney disease, stage 2 (mild): Secondary | ICD-10-CM | POA: Diagnosis not present

## 2018-04-23 DIAGNOSIS — Z23 Encounter for immunization: Secondary | ICD-10-CM | POA: Diagnosis not present

## 2018-04-30 DIAGNOSIS — H524 Presbyopia: Secondary | ICD-10-CM | POA: Diagnosis not present

## 2018-05-14 DIAGNOSIS — H40013 Open angle with borderline findings, low risk, bilateral: Secondary | ICD-10-CM | POA: Diagnosis not present

## 2018-05-14 DIAGNOSIS — H2513 Age-related nuclear cataract, bilateral: Secondary | ICD-10-CM | POA: Diagnosis not present

## 2018-05-14 DIAGNOSIS — E119 Type 2 diabetes mellitus without complications: Secondary | ICD-10-CM | POA: Diagnosis not present

## 2018-08-11 ENCOUNTER — Other Ambulatory Visit: Payer: Self-pay | Admitting: Family Medicine

## 2018-08-11 DIAGNOSIS — Z1231 Encounter for screening mammogram for malignant neoplasm of breast: Secondary | ICD-10-CM

## 2018-08-27 DIAGNOSIS — L820 Inflamed seborrheic keratosis: Secondary | ICD-10-CM | POA: Diagnosis not present

## 2018-09-02 ENCOUNTER — Ambulatory Visit
Admission: RE | Admit: 2018-09-02 | Discharge: 2018-09-02 | Disposition: A | Discharge status: Home / Self Care | Payer: Medicare Other | Source: Ambulatory Visit | Attending: Family Medicine | Admitting: Family Medicine

## 2018-09-02 DIAGNOSIS — Z1231 Encounter for screening mammogram for malignant neoplasm of breast: Secondary | ICD-10-CM | POA: Diagnosis not present

## 2018-11-11 DIAGNOSIS — E1121 Type 2 diabetes mellitus with diabetic nephropathy: Secondary | ICD-10-CM | POA: Diagnosis not present

## 2018-11-11 DIAGNOSIS — I129 Hypertensive chronic kidney disease with stage 1 through stage 4 chronic kidney disease, or unspecified chronic kidney disease: Secondary | ICD-10-CM | POA: Diagnosis not present

## 2018-11-11 DIAGNOSIS — E1140 Type 2 diabetes mellitus with diabetic neuropathy, unspecified: Secondary | ICD-10-CM | POA: Diagnosis not present

## 2018-11-11 DIAGNOSIS — N182 Chronic kidney disease, stage 2 (mild): Secondary | ICD-10-CM | POA: Diagnosis not present

## 2019-01-13 DIAGNOSIS — E1140 Type 2 diabetes mellitus with diabetic neuropathy, unspecified: Secondary | ICD-10-CM | POA: Diagnosis not present

## 2019-01-13 DIAGNOSIS — E785 Hyperlipidemia, unspecified: Secondary | ICD-10-CM | POA: Diagnosis not present

## 2019-04-16 DIAGNOSIS — Z23 Encounter for immunization: Secondary | ICD-10-CM | POA: Diagnosis not present

## 2019-07-15 ENCOUNTER — Other Ambulatory Visit: Payer: Self-pay | Admitting: Family Medicine

## 2019-07-15 DIAGNOSIS — Z1231 Encounter for screening mammogram for malignant neoplasm of breast: Secondary | ICD-10-CM

## 2019-07-15 DIAGNOSIS — E2839 Other primary ovarian failure: Secondary | ICD-10-CM

## 2019-07-15 DIAGNOSIS — I129 Hypertensive chronic kidney disease with stage 1 through stage 4 chronic kidney disease, or unspecified chronic kidney disease: Secondary | ICD-10-CM | POA: Diagnosis not present

## 2019-07-15 DIAGNOSIS — E1121 Type 2 diabetes mellitus with diabetic nephropathy: Secondary | ICD-10-CM | POA: Diagnosis not present

## 2019-07-15 DIAGNOSIS — E1140 Type 2 diabetes mellitus with diabetic neuropathy, unspecified: Secondary | ICD-10-CM | POA: Diagnosis not present

## 2019-07-15 DIAGNOSIS — Z0000 Encounter for general adult medical examination without abnormal findings: Secondary | ICD-10-CM | POA: Diagnosis not present

## 2019-09-04 ENCOUNTER — Other Ambulatory Visit: Payer: Self-pay

## 2019-09-04 ENCOUNTER — Ambulatory Visit
Admission: RE | Admit: 2019-09-04 | Discharge: 2019-09-04 | Disposition: A | Discharge status: Home / Self Care | Payer: Medicare Other | Source: Ambulatory Visit | Attending: Family Medicine | Admitting: Family Medicine

## 2019-09-04 DIAGNOSIS — Z1231 Encounter for screening mammogram for malignant neoplasm of breast: Secondary | ICD-10-CM | POA: Diagnosis not present

## 2019-10-12 ENCOUNTER — Ambulatory Visit
Admission: RE | Admit: 2019-10-12 | Discharge: 2019-10-12 | Disposition: A | Discharge status: Home / Self Care | Payer: Medicare Other | Source: Ambulatory Visit | Attending: Family Medicine | Admitting: Family Medicine

## 2019-10-12 ENCOUNTER — Other Ambulatory Visit: Payer: Self-pay

## 2019-10-12 DIAGNOSIS — Z780 Asymptomatic menopausal state: Secondary | ICD-10-CM | POA: Diagnosis not present

## 2019-10-12 DIAGNOSIS — E2839 Other primary ovarian failure: Secondary | ICD-10-CM

## 2020-01-13 DIAGNOSIS — N183 Chronic kidney disease, stage 3 unspecified: Secondary | ICD-10-CM | POA: Diagnosis not present

## 2020-01-13 DIAGNOSIS — I129 Hypertensive chronic kidney disease with stage 1 through stage 4 chronic kidney disease, or unspecified chronic kidney disease: Secondary | ICD-10-CM | POA: Diagnosis not present

## 2020-01-13 DIAGNOSIS — E1140 Type 2 diabetes mellitus with diabetic neuropathy, unspecified: Secondary | ICD-10-CM | POA: Diagnosis not present

## 2020-01-13 DIAGNOSIS — E1121 Type 2 diabetes mellitus with diabetic nephropathy: Secondary | ICD-10-CM | POA: Diagnosis not present

## 2020-02-01 ENCOUNTER — Ambulatory Visit (INDEPENDENT_AMBULATORY_CARE_PROVIDER_SITE_OTHER): Payer: Medicare Other | Admitting: Orthopaedic Surgery

## 2020-02-02 ENCOUNTER — Ambulatory Visit: Payer: Medicare Other | Admitting: Orthopaedic Surgery

## 2020-02-02 ENCOUNTER — Ambulatory Visit: Payer: Self-pay

## 2020-02-02 ENCOUNTER — Encounter: Payer: Self-pay | Admitting: Orthopaedic Surgery

## 2020-02-02 DIAGNOSIS — Z96642 Presence of left artificial hip joint: Secondary | ICD-10-CM | POA: Diagnosis not present

## 2020-02-02 NOTE — Progress Notes (Signed)
Office Visit Note   Patient: Brandy Scott           Date of Birth: 08-11-1951           MRN: [id number] Visit Date: 02/02/2020              Requested by: Laurann Montana, MD [phone] [address] [place],  [place] [address] PCP: Laurann Montana, MD   Assessment & Plan: Visit Diagnoses:  1. Status post left hip replacement     Plan: Brandy Scott has done very well from her left hip replacement as well as her right hip replacement.  At this point she is released to activity as tolerated and follow-up as needed.  Follow-Up Instructions: Return if symptoms worsen or fail to improve.   Orders:  Orders Placed This Encounter  Procedures  . XR HIP UNILAT W OR W/O PELVIS 2-3 VIEWS LEFT   No orders of the defined types were placed in this encounter.     Procedures: No procedures performed   Clinical Data: No additional findings.   Subjective: Chief Complaint  Patient presents with  . Left Hip - Pain    Niomi is 5 years status post left total hip replacement.  She has no issues and no complaints.   Review of Systems   Objective: Vital Signs: There were no vitals taken for this visit.  Physical Exam  Ortho Exam Left hip exam is benign.  Fully healed surgical scar.  Painless range of motion. Specialty Comments:  No specialty comments available.  Imaging: XR HIP UNILAT W OR W/O PELVIS 2-3 VIEWS LEFT  Result Date: 02/02/2020 Stable total hip replacements.    PMFS History: Patient Active Problem List   Diagnosis Date Noted  . Status post left hip replacement 02/02/2020  . Hip arthritis 12/31/2014  . Osteoarthritis of right hip 07/16/2013  . Chronic right hip pain 04/02/2013  . SVT (supraventricular tachycardia) (HCC) 05/23/2011  . Chest pain 05/02/2011  . Palpitations 05/02/2011  . COPD, moderate, Gold B 03/15/2011   Past Medical History:  Diagnosis Date  . Anxiety   . Arthritis   . Asthma   . COPD (chronic obstructive pulmonary disease)  (HCC)   . Diabetes mellitus   . Gout   . HTN (hypertension)   . Hypercholesteremia   . Shortness of breath   . SVT (supraventricular tachycardia) (HCC)     Family History  Problem Relation Age of Onset  . Hypertension Father   . Diabetes Father   . Parkinsonism Father   . Colon cancer Mother   . Alzheimer's disease Mother   . Clotting disorder Mother        had filter placed  . Breast cancer Neg Hx     Past Surgical History:  Procedure Laterality Date  . CARDIAC CATHETERIZATION     2010      clean  . DILATION AND CURETTAGE OF UTERUS    . HERNIA REPAIR    . HYSTEROSCOPY    . TOTAL HIP ARTHROPLASTY Right 07/16/2013   Procedure: RIGHT TOTAL HIP ARTHROPLASTY ANTERIOR APPROACH;  Surgeon: Cheral Almas, MD;  Location: [facility] OR;  Service: Orthopedics;  Laterality: Right;  . TOTAL HIP ARTHROPLASTY Left 12/31/2014   Procedure: LEFT TOTAL HIP ARTHROPLASTY ANTERIOR APPROACH;  Surgeon: Tarry Kos, MD;  Location: [facility] OR;  Service: Orthopedics;  Laterality: Left;  . TUBAL LIGATION     Social History   Occupational History  . Occupation: unemployed  Tobacco Use  .  Smoking status: Former Smoker    Packs/day: 1.00    Years: 42.00    Pack years: 42.00    Types: Cigarettes    Quit date: 05/14/2010    Years since quitting: 9.7  . Smokeless tobacco: Never Used  Substance and Sexual Activity  . Alcohol use: No  . Drug use: No  . Sexual activity: Yes

## 2020-05-09 DIAGNOSIS — I129 Hypertensive chronic kidney disease with stage 1 through stage 4 chronic kidney disease, or unspecified chronic kidney disease: Secondary | ICD-10-CM | POA: Diagnosis not present

## 2020-05-09 DIAGNOSIS — M62838 Other muscle spasm: Secondary | ICD-10-CM | POA: Diagnosis not present

## 2020-05-20 ENCOUNTER — Ambulatory Visit
Admission: RE | Admit: 2020-05-20 | Discharge: 2020-05-20 | Disposition: A | Discharge status: Home / Self Care | Payer: Medicare Other | Source: Ambulatory Visit | Attending: Family Medicine | Admitting: Family Medicine

## 2020-05-20 ENCOUNTER — Other Ambulatory Visit: Payer: Self-pay | Admitting: Family Medicine

## 2020-05-20 ENCOUNTER — Other Ambulatory Visit: Payer: Self-pay

## 2020-05-20 DIAGNOSIS — M542 Cervicalgia: Secondary | ICD-10-CM

## 2020-05-20 DIAGNOSIS — M62838 Other muscle spasm: Secondary | ICD-10-CM | POA: Diagnosis not present

## 2020-07-18 DIAGNOSIS — I129 Hypertensive chronic kidney disease with stage 1 through stage 4 chronic kidney disease, or unspecified chronic kidney disease: Secondary | ICD-10-CM | POA: Diagnosis not present

## 2020-07-18 DIAGNOSIS — E1140 Type 2 diabetes mellitus with diabetic neuropathy, unspecified: Secondary | ICD-10-CM | POA: Diagnosis not present

## 2020-07-18 DIAGNOSIS — Z0000 Encounter for general adult medical examination without abnormal findings: Secondary | ICD-10-CM | POA: Diagnosis not present

## 2020-07-18 DIAGNOSIS — E1121 Type 2 diabetes mellitus with diabetic nephropathy: Secondary | ICD-10-CM | POA: Diagnosis not present

## 2020-07-18 DIAGNOSIS — Z1159 Encounter for screening for other viral diseases: Secondary | ICD-10-CM | POA: Diagnosis not present

## 2020-08-01 ENCOUNTER — Other Ambulatory Visit: Payer: Self-pay | Admitting: Family Medicine

## 2020-08-01 DIAGNOSIS — Z1231 Encounter for screening mammogram for malignant neoplasm of breast: Secondary | ICD-10-CM

## 2020-09-09 ENCOUNTER — Ambulatory Visit
Admission: RE | Admit: 2020-09-09 | Discharge: 2020-09-09 | Disposition: A | Discharge status: Home / Self Care | Payer: Medicare Other | Source: Ambulatory Visit | Attending: Family Medicine | Admitting: Family Medicine

## 2020-09-09 ENCOUNTER — Other Ambulatory Visit: Payer: Self-pay

## 2020-09-09 DIAGNOSIS — Z1231 Encounter for screening mammogram for malignant neoplasm of breast: Secondary | ICD-10-CM | POA: Diagnosis not present

## 2020-10-28 DIAGNOSIS — H524 Presbyopia: Secondary | ICD-10-CM | POA: Diagnosis not present

## 2020-12-07 DIAGNOSIS — H40013 Open angle with borderline findings, low risk, bilateral: Secondary | ICD-10-CM | POA: Diagnosis not present

## 2020-12-07 DIAGNOSIS — H2513 Age-related nuclear cataract, bilateral: Secondary | ICD-10-CM | POA: Diagnosis not present

## 2020-12-07 DIAGNOSIS — H02839 Dermatochalasis of unspecified eye, unspecified eyelid: Secondary | ICD-10-CM | POA: Diagnosis not present

## 2020-12-07 DIAGNOSIS — E119 Type 2 diabetes mellitus without complications: Secondary | ICD-10-CM | POA: Diagnosis not present

## 2021-01-16 DIAGNOSIS — E1140 Type 2 diabetes mellitus with diabetic neuropathy, unspecified: Secondary | ICD-10-CM | POA: Diagnosis not present

## 2021-01-16 DIAGNOSIS — E1121 Type 2 diabetes mellitus with diabetic nephropathy: Secondary | ICD-10-CM | POA: Diagnosis not present

## 2021-01-16 DIAGNOSIS — I129 Hypertensive chronic kidney disease with stage 1 through stage 4 chronic kidney disease, or unspecified chronic kidney disease: Secondary | ICD-10-CM | POA: Diagnosis not present

## 2021-01-16 DIAGNOSIS — N183 Chronic kidney disease, stage 3 unspecified: Secondary | ICD-10-CM | POA: Diagnosis not present

## 2021-01-16 DIAGNOSIS — Z7984 Long term (current) use of oral hypoglycemic drugs: Secondary | ICD-10-CM | POA: Diagnosis not present

## 2021-01-16 DIAGNOSIS — E785 Hyperlipidemia, unspecified: Secondary | ICD-10-CM | POA: Diagnosis not present

## 2021-07-27 DIAGNOSIS — Z7984 Long term (current) use of oral hypoglycemic drugs: Secondary | ICD-10-CM | POA: Diagnosis not present

## 2021-07-27 DIAGNOSIS — Z0000 Encounter for general adult medical examination without abnormal findings: Secondary | ICD-10-CM | POA: Diagnosis not present

## 2021-07-27 DIAGNOSIS — N183 Chronic kidney disease, stage 3 unspecified: Secondary | ICD-10-CM | POA: Diagnosis not present

## 2021-07-27 DIAGNOSIS — I129 Hypertensive chronic kidney disease with stage 1 through stage 4 chronic kidney disease, or unspecified chronic kidney disease: Secondary | ICD-10-CM | POA: Diagnosis not present

## 2021-07-27 DIAGNOSIS — E1140 Type 2 diabetes mellitus with diabetic neuropathy, unspecified: Secondary | ICD-10-CM | POA: Diagnosis not present

## 2021-07-27 DIAGNOSIS — E785 Hyperlipidemia, unspecified: Secondary | ICD-10-CM | POA: Diagnosis not present

## 2021-08-03 DIAGNOSIS — J441 Chronic obstructive pulmonary disease with (acute) exacerbation: Secondary | ICD-10-CM | POA: Diagnosis not present

## 2021-08-03 DIAGNOSIS — R059 Cough, unspecified: Secondary | ICD-10-CM | POA: Diagnosis not present

## 2021-08-09 ENCOUNTER — Other Ambulatory Visit: Payer: Self-pay | Admitting: *Deleted

## 2021-08-09 ENCOUNTER — Other Ambulatory Visit: Payer: Self-pay | Admitting: Family Medicine

## 2021-08-09 DIAGNOSIS — Z1231 Encounter for screening mammogram for malignant neoplasm of breast: Secondary | ICD-10-CM

## 2021-08-09 DIAGNOSIS — Z87891 Personal history of nicotine dependence: Secondary | ICD-10-CM

## 2021-09-04 ENCOUNTER — Ambulatory Visit (INDEPENDENT_AMBULATORY_CARE_PROVIDER_SITE_OTHER): Payer: Medicare Other | Admitting: Acute Care

## 2021-09-04 ENCOUNTER — Encounter: Payer: Self-pay | Admitting: Acute Care

## 2021-09-04 ENCOUNTER — Other Ambulatory Visit: Payer: Self-pay

## 2021-09-04 ENCOUNTER — Ambulatory Visit (INDEPENDENT_AMBULATORY_CARE_PROVIDER_SITE_OTHER)
Admission: RE | Admit: 2021-09-04 | Discharge: 2021-09-04 | Disposition: A | Discharge status: Home / Self Care | Payer: Medicare Other | Source: Ambulatory Visit | Attending: Cardiology | Admitting: Cardiology

## 2021-09-04 DIAGNOSIS — Z87891 Personal history of nicotine dependence: Secondary | ICD-10-CM

## 2021-09-04 NOTE — Patient Instructions (Signed)
Thank you for participating in the [facility]. °It was our pleasure to meet you today. °We will call you with the results of your scan within the next few days. °Your scan will be assigned a Lung RADS category score by the physicians reading the scans.  °This Lung RADS score determines follow up scanning.  °See below for description of categories, and follow up screening recommendations. °We will be in touch to schedule your follow up screening annually or based on recommendations of our providers. °We will fax a copy of your scan results to your Primary Care Physician, or the physician who referred you to the program, to ensure they have the results. °Please call the office if you have any questions or concerns regarding your scanning experience or results.  °Our office number is [phone]. °Please speak with Denise Phelps, RN. She is our Lung Cancer Screening RN. °If she is unavailable when you call, please have the office staff send her a message. She will return your call at her earliest convenience. °Remember, if your scan is normal, we will scan you annually as long as you continue to meet the criteria for the program. (Age 55-77, Current smoker or smoker who has quit within the last 15 years). °If you are a smoker, remember, quitting is the single most powerful action that you can take to decrease your risk of lung cancer and other pulmonary, breathing related problems. °We know quitting is hard, and we are here to help.  °Please let us know if there is anything we can do to help you meet your goal of quitting. °If you are a former smoker, congratulations. We are proud of you! Remain smoke free! °Remember you can refer friends or family members through the number above.  °We will screen them to make sure they meet criteria for the program. °Thank you for helping us take better care of you by participating in Lung Screening. ° °You can receive free nicotine replacement therapy  ( patches, gum or mints) by calling 1-800-QUIT NOW. Please call so we can get you on the path to becoming  a non-smoker. I know it is hard, but you can do this! ° °Lung RADS Categories: ° °Lung RADS 1: no nodules or definitely non-concerning nodules.  °Recommendation is for a repeat annual scan in 12 months. ° °Lung RADS 2:  nodules that are non-concerning in appearance and behavior with a very low likelihood of becoming an active cancer. °Recommendation is for a repeat annual scan in 12 months. ° °Lung RADS 3: nodules that are probably non-concerning , includes nodules with a low likelihood of becoming an active cancer.  Recommendation is for a 6-month repeat screening scan. Often noted after an upper respiratory illness. We will be in touch to make sure you have no questions, and to schedule your 6-month scan. ° °Lung RADS 4 A: nodules with concerning findings, recommendation is most often for a follow up scan in 3 months or additional testing based on our provider's assessment of the scan. We will be in touch to make sure you have no questions and to schedule the recommended 3 month follow up scan. ° °Lung RADS 4 B:  indicates findings that are concerning. We will be in touch with you to schedule additional diagnostic testing based on our provider's  assessment of the scan. ° °Hypnosis for smoking cessation  °[employer] °[phone] ° °Acupuncture for smoking cessation  °[facility] °[phone]  °

## 2021-09-04 NOTE — Progress Notes (Addendum)
Virtual Visit via Telephone Note  I connected with Brandy Scott on 06/27/21 at  2:00 PM EST by telephone and verified that I am speaking with the correct person using two identifiers.  Location: Patient: Home Provider: Working from Home   I discussed the limitations, risks, security and privacy concerns of performing an evaluation and management service by telephone and the availability of in person appointments. I also discussed with the patient that there may be a patient responsible charge related to this service. The patient expressed understanding and agreed to proceed.  Shared Decision Making Visit Lung Cancer Screening Program [phone])   Eligibility: Age 71 y.o. Pack Years Smoking History Calculation 42 (# packs/per year x # years smoked) Recent History of coughing up blood  no Unexplained weight loss? no ( >Than 15 pounds within the last 6 months ) Prior History Lung / other cancer no (Diagnosis within the last 5 years already requiring surveillance chest CT Scans). Smoking Status Former Smoker Former Smokers: Years since quit: 11 years  Quit Date: 03/2011  Visit Components: Discussion included one or more decision making aids. yes Discussion included risk/benefits of screening. yes Discussion included potential follow up diagnostic testing for abnormal scans. yes Discussion included meaning and risk of over diagnosis. yes Discussion included meaning and risk of False Positives. yes Discussion included meaning of total radiation exposure. yes  Counseling Included: Importance of adherence to annual lung cancer LDCT screening. yes Impact of comorbidities on ability to participate in the program. yes Ability and willingness to under diagnostic treatment. yes  Smoking Cessation Counseling: Current Smokers:  Discussed importance of smoking cessation. yes Information about tobacco cessation classes and interventions provided to patient. yes Patient provided with  "ticket" for LDCT Scan. yes Symptomatic Patient. no  Counseling NA Diagnosis Code: Tobacco Use Z72.0 Asymptomatic Patient yes  Counseling NA Former Smokers:  Discussed the importance of maintaining cigarette abstinence. yes Diagnosis Code: Personal History of Nicotine Dependence. Y30.160 Information about tobacco cessation classes and interventions provided to patient. Yes Patient provided with "ticket" for LDCT Scan. yes Written Order for Lung Cancer Screening with LDCT placed in Epic. Yes (CT Chest Lung Cancer Screening Low Dose W/O CM) [id number] Z12.2-Screening of respiratory organs Z87.891-Personal history of nicotine dependence   I spent 25 minutes of face to face time with her discussing the risks and benefits of lung cancer screening. We viewed a power point together that explained in detail the above noted topics. We took the time to pause the power point at intervals to allow for questions to be asked and answered to ensure understanding. We discussed that she had taken the single most powerful action possible to decrease her risk of developing lung cancer when he quit smoking. I counseled her to remain smoke free, and to contact me if he ever had the desire to smoke again so that I can provide resources and tools to help support the effort to remain smoke free. We discussed the time and location of the scan, and that either  [name] or I will call with the results within  24-48 hours of receiving them. he has my card and contact information in the event he needs to speak with me, in addition to a copy of the power point we reviewed as a resource. She verbalized understanding of all of the above and had no further questions upon leaving the office.     I explained to the patient that there has been a high incidence of  coronary artery disease noted on these exams. I explained that this is a non-gated exam therefore degree or severity cannot be determined. This patient is not on  statin therapy. I have asked the patient to follow-up with their PCP regarding any incidental finding of coronary artery disease and management with diet or medication as they feel is clinically indicated. The patient verbalized understanding of the above and had no further questions.  Brandy Scott D. Tiburcio Pea, NP-C [facility] Personal contact information can be found on Amion  09/04/2021, 9:44 AM

## 2021-09-05 ENCOUNTER — Telehealth: Payer: Self-pay | Admitting: Acute Care

## 2021-09-05 NOTE — Telephone Encounter (Signed)
I have called the patient with the results of her low-dose CT.  I explained that her low-dose CT scan done January 24 was read as a lung RADS 4A, suspicious.  There is notation of scattered solid pulmonary nodules the largest which is located in the medial right lower lobe which measures 8.0 mm in mean diameter.  Additional reference solid nodule in the right lower lobe measures 7.5 mm.  Recommendation is for a 53-month follow-up low-dose CT.  Patient is in agreement with this plan.  I explained that the 74-month follow-up will be ordered for after December 04, 2021.  Additionally I explained to the patient that there was notation of left main and three-vessel coronary artery calcifications.  She is currently on olmesartan hydrochlorothiazide daily to help manage elevated cholesterol through her primary care provider.  I have encouraged the patient to discuss the findings of this exam with Dr. Laurann Montana, so that she can advise the patient on any additional follow up, if any, she feels may be needed.   Angelique Blonder, please fax results to Dr. Lucilla Lame office, and let her know we will do a follow up low dose in 3 months to better evaluate this nodule.  Additionally please order 3 month follow up low dose CT Chest. Thanks so much

## 2021-09-05 NOTE — Telephone Encounter (Signed)
Received a call report from [facility] for patient's lung cancer screening CT. Below are the impressions:   IMPRESSION: 1. Solid pulmonary nodules, largest measures 8 mm and is located in the right lower lobe. Lung-RADS 4AS, suspicious. Follow up low-dose chest CT without contrast in 3 months (please use the following order, "CT CHEST LCS NODULE FOLLOW-UP W/O CM") is recommended. Alternatively, PET may be considered when there is a solid component 74mm or larger. S modifier for coronary artery calcifications. 2. Left main and three-vessel coronary artery calcifications, recommend ASCVD risk assessment. 3. Aortic Atherosclerosis (ICD10-I70.0) and Emphysema (ICD10-J43.9).  Maralyn Sago, can you please advise? Thanks!

## 2021-09-06 ENCOUNTER — Other Ambulatory Visit: Payer: Self-pay

## 2021-09-06 DIAGNOSIS — R911 Solitary pulmonary nodule: Secondary | ICD-10-CM

## 2021-09-06 DIAGNOSIS — Z87891 Personal history of nicotine dependence: Secondary | ICD-10-CM

## 2021-09-06 NOTE — Telephone Encounter (Signed)
Order placed for 3 month follow up CT/nodule.  Results routed to PCP with plan for follow up

## 2021-09-11 ENCOUNTER — Ambulatory Visit: Payer: Medicare Other

## 2021-09-18 ENCOUNTER — Encounter: Payer: Self-pay | Admitting: Cardiovascular Disease

## 2021-09-18 ENCOUNTER — Ambulatory Visit: Payer: Medicare Other | Admitting: Cardiovascular Disease

## 2021-09-18 ENCOUNTER — Other Ambulatory Visit: Payer: Self-pay

## 2021-09-18 VITALS — BP 138/64 | HR 70 | Ht 67.0 in | Wt 196.0 lb

## 2021-09-18 DIAGNOSIS — I2510 Atherosclerotic heart disease of native coronary artery without angina pectoris: Secondary | ICD-10-CM

## 2021-09-18 NOTE — Progress Notes (Signed)
Chief Complaint  Patient presents with   New Patient (Initial Visit)    CAD on CT chest   History of Present Illness: 71 yo female with history of anxiety, arthritis, asthma, former tobacco abuse, COPD, DM, gout, HTN, hyperlipidemia, CKD and SVT who is here today as a new consult for the evaluation of abnormal chest CT with evidence of three vessel coronary artery calcification. I saw her in 2012. Cardiac cath in 2012 with no evidence of CAD. She is intolerant of statins.   She tells me today that she feels well. No chest pain or dyspnea. She no longer smokes. No LE edema, palpitations, near syncope or syncope.   Primary Care Physician: Laurann Montana, MD   Past Medical History:  Diagnosis Date   Anxiety    Arthritis    Asthma    COPD (chronic obstructive pulmonary disease) (HCC)    Diabetes mellitus    Gout    HTN (hypertension)    Hypercholesteremia    Shortness of breath    SVT (supraventricular tachycardia) (HCC)     Past Surgical History:  Procedure Laterality Date   CARDIAC CATHETERIZATION     2010      clean   DILATION AND CURETTAGE OF UTERUS     HERNIA REPAIR     HYSTEROSCOPY     TOTAL HIP ARTHROPLASTY Right 07/16/2013   Procedure: RIGHT TOTAL HIP ARTHROPLASTY ANTERIOR APPROACH;  Surgeon: Cheral Almas, MD;  Location: [facility] OR;  Service: Orthopedics;  Laterality: Right;   TOTAL HIP ARTHROPLASTY Left 12/31/2014   Procedure: LEFT TOTAL HIP ARTHROPLASTY ANTERIOR APPROACH;  Surgeon: Tarry Kos, MD;  Location: [facility] OR;  Service: Orthopedics;  Laterality: Left;   TUBAL LIGATION      Current Outpatient Medications  Medication Sig Dispense Refill   aspirin 81 MG tablet Take 81 mg by mouth daily.     fenofibrate (TRICOR) 145 MG tablet Take 145 mg by mouth daily.   1   metFORMIN (GLUCOPHAGE) 500 MG tablet Take 500 mg by mouth daily with breakfast.     olmesartan-hydrochlorothiazide (BENICAR HCT) 20-12.5 MG tablet Take 1 tablet by mouth daily.     No current  facility-administered medications for this visit.    Allergies  Allergen Reactions   Lyrica [Pregabalin]     Patient states Lyrica makes her very dizzy   Statins     Cause, muscle soreness and shortness of breath    Social History   Socioeconomic History   Marital status: Married    Spouse name: Not on file   Number of children: 2   Years of education: Not on file   Highest education level: Not on file  Occupational History   Occupation: unemployed   Occupation: Retired Warehouse manager work  Tobacco Use   Smoking status: Former    Packs/day: 1.00    Years: 42.00    Pack years: 42.00    Types: Cigarettes    Quit date: 03/2011    Years since quitting: 10.5   Smokeless tobacco: Never  Substance and Sexual Activity   Alcohol use: No   Drug use: No   Sexual activity: Yes  Other Topics Concern   Not on file  Social History Narrative   Not on file   Social Determinants of Health   Financial Resource Strain: Not on file  Food Insecurity: Not on file  Transportation Needs: Not on file  Physical Activity: Not on file  Stress: Not on file  Social Connections:  Not on file  Intimate Partner Violence: Not on file    Family History  Problem Relation Age of Onset   Colon cancer Mother    Alzheimer's disease Mother    Clotting disorder Mother        had filter placed   Hypertension Father    Diabetes Father    Parkinsonism Father    Breast cancer Neg Hx     Review of Systems:  As stated in the HPI and otherwise negative.   BP 138/64    Pulse 70    Ht 5\' 7"  (1.702 m)    Wt 196 lb (88.9 kg)    SpO2 95%    BMI 30.70 kg/m   Physical Examination: General: Well developed, well nourished, NAD  HEENT: OP clear, mucus membranes moist  SKIN: warm, dry. No rashes. Neuro: No focal deficits  Musculoskeletal: Muscle strength 5/5 all ext  Psychiatric: Mood and affect normal  Neck: No JVD, no carotid bruits, no thyromegaly, no lymphadenopathy.  Lungs:Clear bilaterally, no wheezes,  rhonci, crackles Cardiovascular: Regular rate and rhythm. No murmurs, gallops or rubs. Abdomen:Soft. Bowel sounds present. Non-tender.  Extremities: No lower extremity edema. Pulses are 2 + in the bilateral DP/PT.  EKG:  EKG is ordered today. The ekg ordered today demonstrates sinus, poor R wave progression precordial leads  Recent Labs: No results found for requested labs within last 8760 hours.   Lipid Panel No results found for: CHOL, TRIG, HDL, CHOLHDL, VLDL, LDLCALC, LDLDIRECT   Wt Readings from Last 3 Encounters:  09/18/21 196 lb (88.9 kg)  07/14/15 245 lb (111.1 kg)  12/31/14 246 lb 8 oz (111.8 kg)    Assessment and Plan:   1. CAD without angina: Evidence of coronary artery calcification by chest CT. She has no signs or symptoms suggestive of angina. We have discussed stress testing but since she is asymptomatic, she does not wish to proceed at this time. Continue ASA. She is statin intolerant. She will call with any change in her symptoms.   Labs/ tests ordered today include:   Orders Placed This Encounter  Procedures   EKG 12-Lead   Disposition:   F/U with me in one year.   Signed, 01/02/15, MD 09/18/2021 2:40 PM    [facility] [address] [place], [place], [place]  [place] Phone: [phone]; Fax: [phone]

## 2021-09-18 NOTE — Patient Instructions (Signed)
Medication Instructions:  Your physician recommends that you continue on your current medications as directed. Please refer to the Current Medication list given to you today.  *If you need a refill on your cardiac medications before your next appointment, please call your pharmacy*   Lab Work: None Ordered If you have labs (blood work) drawn today and your tests are completely normal, you will receive your results only by: [facility] (if you have MyChart) OR A paper copy in the mail If you have any lab test that is abnormal or we need to change your treatment, we will call you to review the results.   Testing/Procedures: None Ordered   Follow-Up: At [facility], you and your health needs are our priority.  As part of our continuing mission to provide you with exceptional heart care, we have created designated Provider Care Teams.  These Care Teams include your primary Cardiologist (physician) and Advanced Practice Providers (APPs -  Physician Assistants and Nurse Practitioners) who all work together to provide you with the care you need, when you need it.  We recommend signing up for the patient portal called "MyChart".  Sign up information is provided on this After Visit Summary.  MyChart is used to connect with patients for Virtual Visits (Telemedicine).  Patients are able to view lab/test results, encounter notes, upcoming appointments, etc.  Non-urgent messages can be sent to your provider as well.   To learn more about what you can do with MyChart, go to [email or web address].    Your next appointment:   12 month(s)  The format for your next appointment:   In Person  Provider:   Lauree Chandler, MD    Other Instructions

## 2021-09-19 ENCOUNTER — Ambulatory Visit
Admission: RE | Admit: 2021-09-19 | Discharge: 2021-09-19 | Disposition: A | Discharge status: Home / Self Care | Payer: Medicare Other | Source: Ambulatory Visit | Attending: Family Medicine | Admitting: Family Medicine

## 2021-09-19 DIAGNOSIS — Z1231 Encounter for screening mammogram for malignant neoplasm of breast: Secondary | ICD-10-CM | POA: Diagnosis not present

## 2021-11-29 ENCOUNTER — Telehealth: Payer: Self-pay | Admitting: Acute Care

## 2021-11-29 DIAGNOSIS — R918 Other nonspecific abnormal finding of lung field: Secondary | ICD-10-CM

## 2021-11-29 NOTE — Telephone Encounter (Signed)
I attempted to call [employer] [place] to get approval for a low Dose Ct Chest follow up without contrast for this patient with an 8 mm and 7.5 mm nodules in her right lower lobe. [employer] [place] had pended the approval for peer to peer review. [employer] [facility] physician reviewers told me they would not approve the scan as ordered. They do not approve low dose CT Chest follow up scans period.  They will only approve CT Chest without contrast. I tried to explained that the purpose of using the low Dose Ct format as follow up , was to allow for the use of the same digital formatting to allow for the radiologists to read the scans in the same format , which allows fore better assessment of small changed in nodule sizes.  ? ? ?[employer] [place] denied the low dose CT Chest follow up without contrast that was requested. They threatened me with denial and delay of care In a patient with new pulmonary nodules. As a result, I have had to change the order to prevent delay of care in this patient because of Insurance bullying. It is not the order that the radiologist who read the initial screening Ct and is involved in the patient's care requested. [employer] is the [facility] who continues to deny the recommended follow up per the radiologist. ( Low Dose Ct follow up without contrast) I was threatened by two insurance physicians  " change the order or this will be a denial" . ?There was no consideration of delay of patient care in their algorithm.As a result I have HAD to change the order to prevent delay of care in this patient.  ?[employer] [place] is the only [employer] that denies a low dose follow up Ct Chest without contrast, as the reading radiologist recommends. All other medicare plans we have encountered cover this.  ?The resolve of this needs to be a different ICD code for the annual lung cancer screening, and the  follow up low dose CT Chest without contrast so that [employer] will cover the physician  requested orders.  ?

## 2021-12-04 ENCOUNTER — Ambulatory Visit (INDEPENDENT_AMBULATORY_CARE_PROVIDER_SITE_OTHER)
Admission: RE | Admit: 2021-12-04 | Discharge: 2021-12-04 | Disposition: A | Discharge status: Home / Self Care | Payer: Medicare Other | Source: Ambulatory Visit | Attending: Acute Care | Admitting: Acute Care

## 2021-12-04 DIAGNOSIS — R918 Other nonspecific abnormal finding of lung field: Secondary | ICD-10-CM | POA: Diagnosis not present

## 2021-12-04 DIAGNOSIS — I700 Atherosclerosis of aorta: Secondary | ICD-10-CM | POA: Diagnosis not present

## 2021-12-04 DIAGNOSIS — J439 Emphysema, unspecified: Secondary | ICD-10-CM | POA: Diagnosis not present

## 2021-12-08 ENCOUNTER — Telehealth: Payer: Self-pay | Admitting: Acute Care

## 2021-12-08 NOTE — Telephone Encounter (Signed)
Spoke with pt and reviewed chest CT results. I let pt know that we will repeat her lung screening CT scan in 1 year. I also advised pt of a thyroid nodule that was seen. I advised pt that we would be sending Dr Cliffton Asters a copy of the CT results so that she can get pt scheduled for a thyroid ultrasound. Pt verbalized understanding and had no further questions.  ?

## 2021-12-08 NOTE — Telephone Encounter (Signed)
Patient is calling about her CT scan results. Please advise ?

## 2021-12-14 ENCOUNTER — Other Ambulatory Visit: Payer: Self-pay | Admitting: Family Medicine

## 2021-12-14 DIAGNOSIS — E041 Nontoxic single thyroid nodule: Secondary | ICD-10-CM

## 2021-12-18 ENCOUNTER — Other Ambulatory Visit: Payer: Self-pay | Admitting: Acute Care

## 2021-12-18 DIAGNOSIS — Z122 Encounter for screening for malignant neoplasm of respiratory organs: Secondary | ICD-10-CM

## 2021-12-18 DIAGNOSIS — Z87891 Personal history of nicotine dependence: Secondary | ICD-10-CM

## 2021-12-19 ENCOUNTER — Ambulatory Visit
Admission: RE | Admit: 2021-12-19 | Discharge: 2021-12-19 | Disposition: A | Discharge status: Home / Self Care | Payer: Medicare Other | Source: Ambulatory Visit | Attending: Family Medicine | Admitting: Family Medicine

## 2021-12-19 DIAGNOSIS — E041 Nontoxic single thyroid nodule: Secondary | ICD-10-CM | POA: Diagnosis not present

## 2021-12-20 DIAGNOSIS — H524 Presbyopia: Secondary | ICD-10-CM | POA: Diagnosis not present

## 2022-01-05 DIAGNOSIS — E119 Type 2 diabetes mellitus without complications: Secondary | ICD-10-CM | POA: Diagnosis not present

## 2022-01-05 DIAGNOSIS — H40013 Open angle with borderline findings, low risk, bilateral: Secondary | ICD-10-CM | POA: Diagnosis not present

## 2022-01-05 DIAGNOSIS — H02839 Dermatochalasis of unspecified eye, unspecified eyelid: Secondary | ICD-10-CM | POA: Diagnosis not present

## 2022-01-05 DIAGNOSIS — H2513 Age-related nuclear cataract, bilateral: Secondary | ICD-10-CM | POA: Diagnosis not present

## 2022-01-26 DIAGNOSIS — N183 Chronic kidney disease, stage 3 unspecified: Secondary | ICD-10-CM | POA: Diagnosis not present

## 2022-01-26 DIAGNOSIS — I129 Hypertensive chronic kidney disease with stage 1 through stage 4 chronic kidney disease, or unspecified chronic kidney disease: Secondary | ICD-10-CM | POA: Diagnosis not present

## 2022-01-26 DIAGNOSIS — E785 Hyperlipidemia, unspecified: Secondary | ICD-10-CM | POA: Diagnosis not present

## 2022-01-26 DIAGNOSIS — E1140 Type 2 diabetes mellitus with diabetic neuropathy, unspecified: Secondary | ICD-10-CM | POA: Diagnosis not present

## 2022-02-05 DIAGNOSIS — H8110 Benign paroxysmal vertigo, unspecified ear: Secondary | ICD-10-CM | POA: Diagnosis not present

## 2022-02-05 DIAGNOSIS — I129 Hypertensive chronic kidney disease with stage 1 through stage 4 chronic kidney disease, or unspecified chronic kidney disease: Secondary | ICD-10-CM | POA: Diagnosis not present

## 2022-05-02 IMAGING — CR DG CERVICAL SPINE 2 OR 3 VIEWS
4 series · 4 of 4 positions shown · non-contrast
Comparison: None.

CLINICAL DATA: Neck pain

EXAM:
CERVICAL SPINE - 2-3 VIEW

[w cervical spine lat]
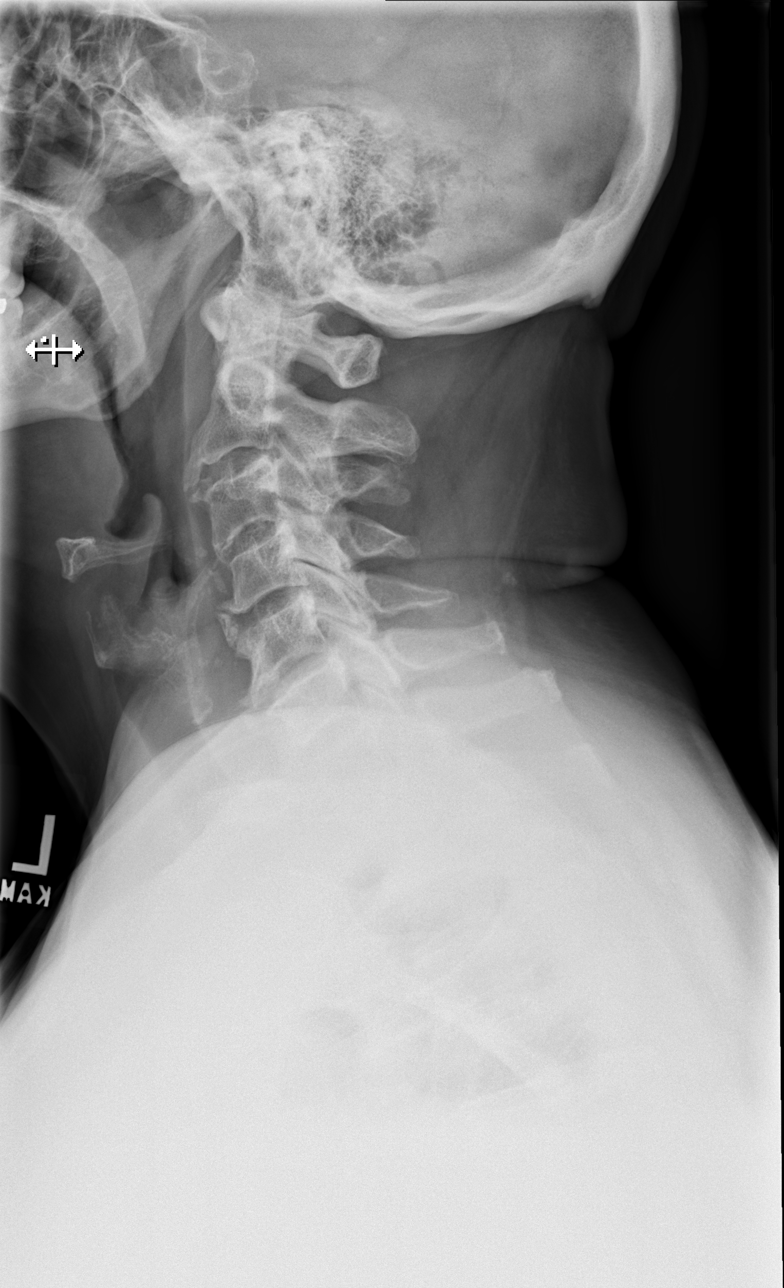

[w cervical swimmers]
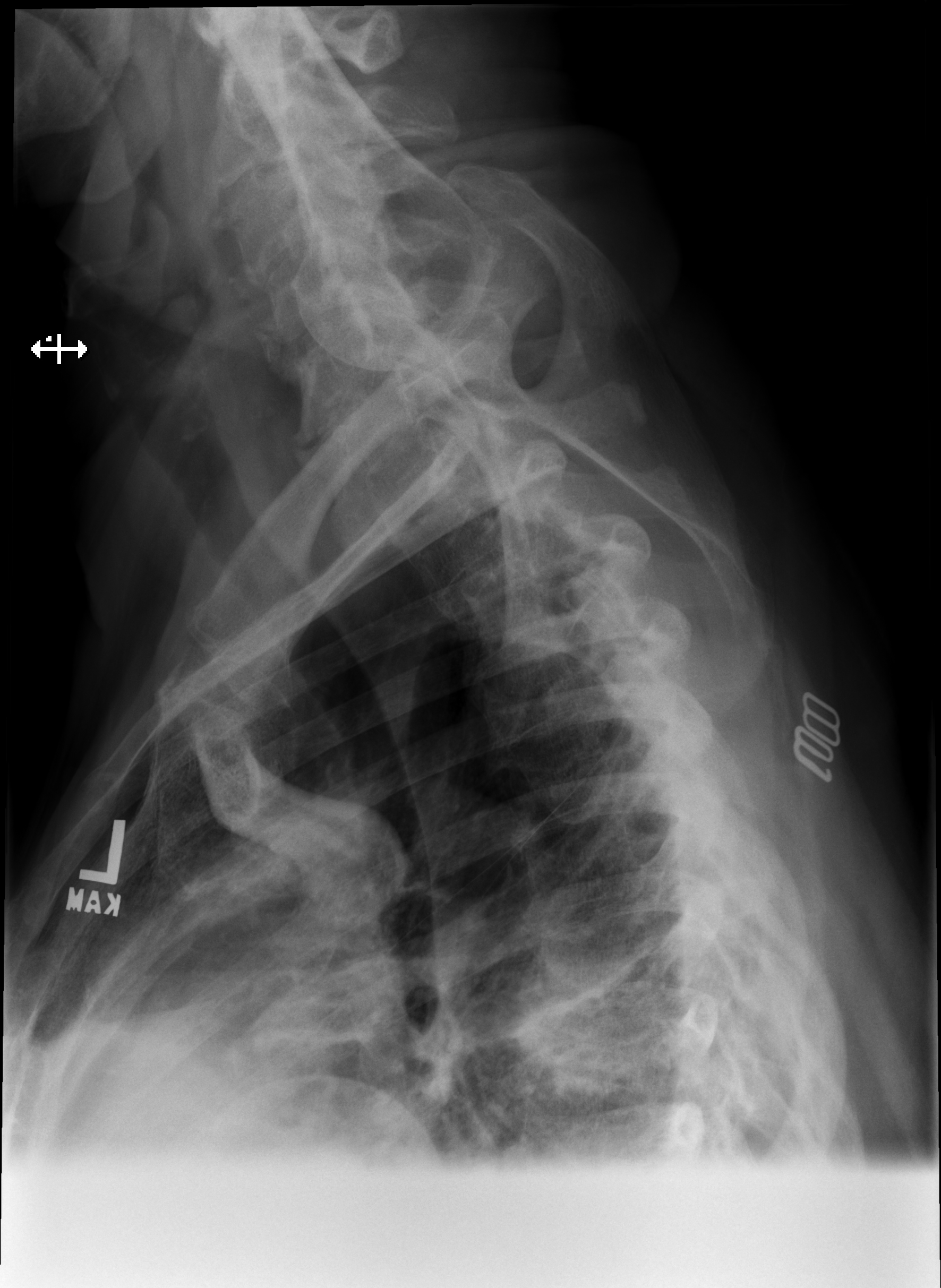

[w cervical spine ap]
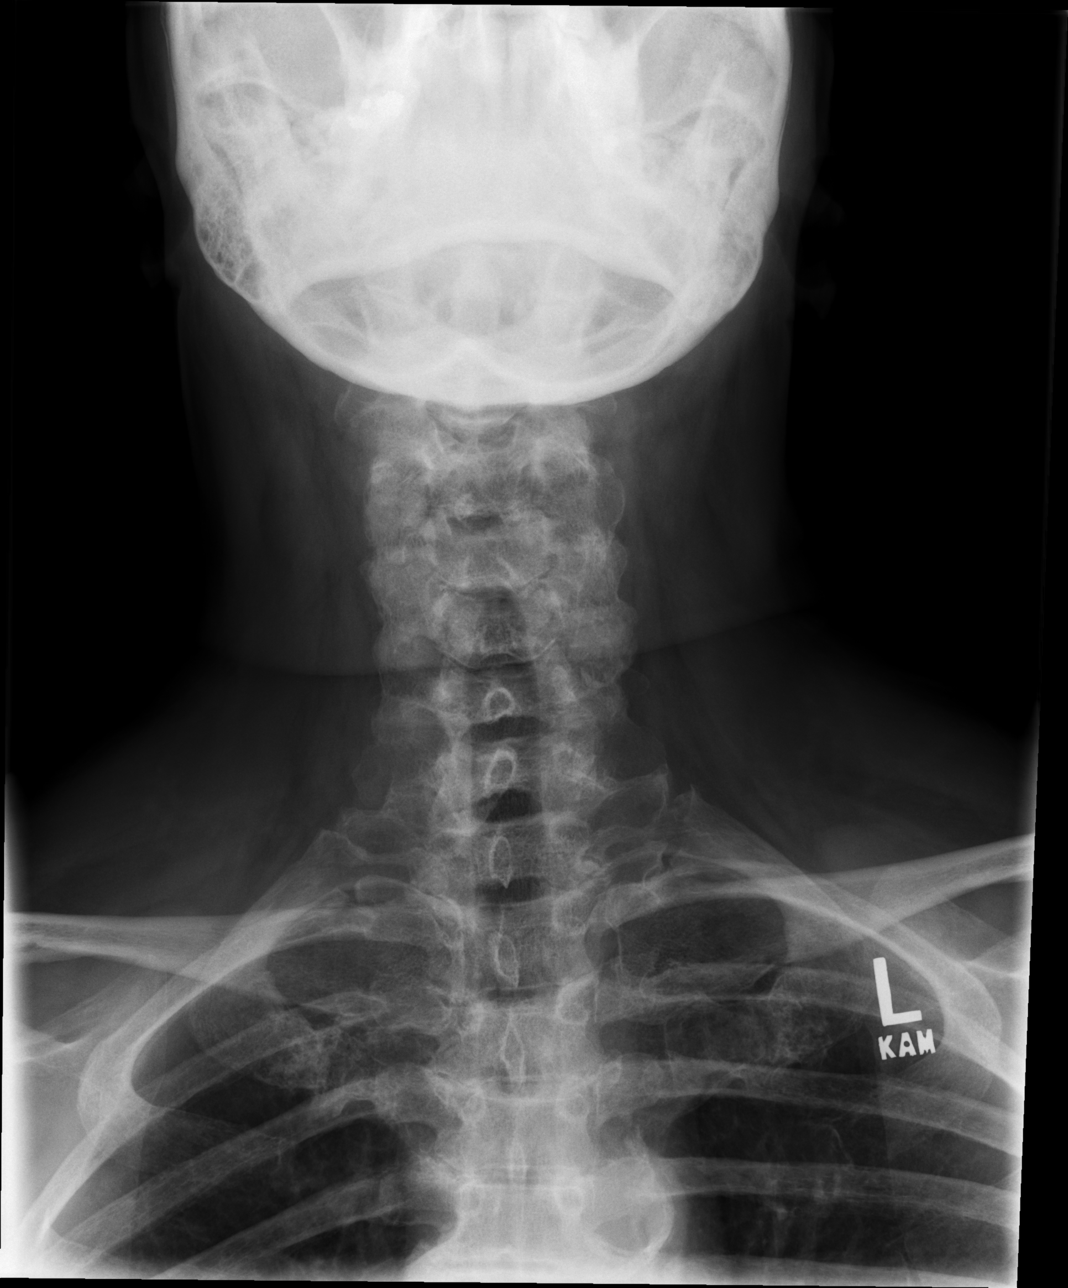

[w cervical spine odontoid]
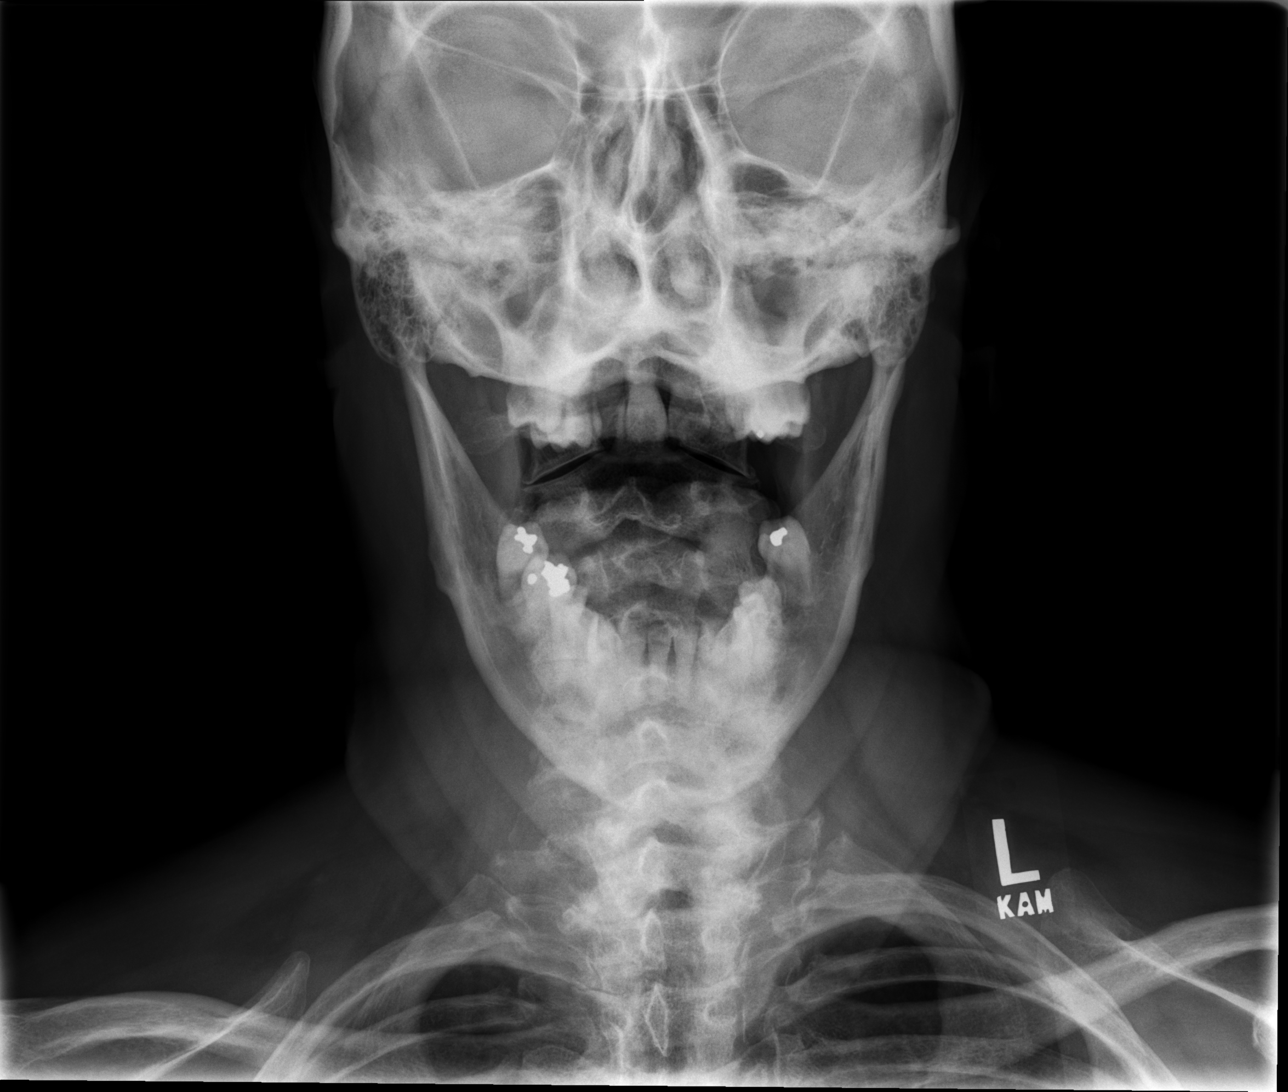

[4 of 4 positions shown; findings below may reference images not displayed]

FINDINGS: Four view radiograph of the cervical spine demonstrates the spine
from the occiput through C7. C7-T1 is not well visualized on this
examination. Normal cervical lordosis. No acute fracture or
listhesis of the cervical spine. Vertebral body height has been
preserved. Mild disc space narrowing is seen at C4-5 and C5-6 in
keeping with changes of mild degenerative disc disease at these
levels. There are degenerative endplate changes noted throughout the
cervical spine, most prominent at C2-3, C4-5, and C5-6. There is
diffuse congenital narrowing of the spinal canal. There is
multilevel facet arthrosis noted, not well profiled on this
examination. The prevertebral soft tissues are unremarkable.
IMPRESSION: Mild degenerative disc disease diffusely throughout the cervical
spine. Superimposed facet arthrosis is not well profiled.

Mild congenital narrowing of the spinal canal.

C7-T1 not well visualized on this exam.

## 2022-08-20 ENCOUNTER — Other Ambulatory Visit: Payer: Self-pay | Admitting: Family Medicine

## 2022-08-20 DIAGNOSIS — Z1231 Encounter for screening mammogram for malignant neoplasm of breast: Secondary | ICD-10-CM

## 2022-08-28 DIAGNOSIS — E1121 Type 2 diabetes mellitus with diabetic nephropathy: Secondary | ICD-10-CM | POA: Diagnosis not present

## 2022-08-28 DIAGNOSIS — I129 Hypertensive chronic kidney disease with stage 1 through stage 4 chronic kidney disease, or unspecified chronic kidney disease: Secondary | ICD-10-CM | POA: Diagnosis not present

## 2022-08-28 DIAGNOSIS — E785 Hyperlipidemia, unspecified: Secondary | ICD-10-CM | POA: Diagnosis not present

## 2022-08-28 DIAGNOSIS — I471 Supraventricular tachycardia, unspecified: Secondary | ICD-10-CM | POA: Diagnosis not present

## 2022-08-28 DIAGNOSIS — N1831 Chronic kidney disease, stage 3a: Secondary | ICD-10-CM | POA: Diagnosis not present

## 2022-09-26 DIAGNOSIS — L820 Inflamed seborrheic keratosis: Secondary | ICD-10-CM | POA: Diagnosis not present

## 2022-10-01 DIAGNOSIS — J069 Acute upper respiratory infection, unspecified: Secondary | ICD-10-CM | POA: Diagnosis not present

## 2022-10-01 DIAGNOSIS — U071 COVID-19: Secondary | ICD-10-CM | POA: Diagnosis not present

## 2022-10-01 DIAGNOSIS — J0100 Acute maxillary sinusitis, unspecified: Secondary | ICD-10-CM | POA: Diagnosis not present

## 2022-10-09 ENCOUNTER — Ambulatory Visit
Admission: RE | Admit: 2022-10-09 | Discharge: 2022-10-09 | Disposition: A | Discharge status: Home / Self Care | Payer: Medicare Other | Source: Ambulatory Visit | Attending: Family Medicine | Admitting: Family Medicine

## 2022-10-09 DIAGNOSIS — Z1231 Encounter for screening mammogram for malignant neoplasm of breast: Secondary | ICD-10-CM | POA: Diagnosis not present

## 2022-10-11 ENCOUNTER — Other Ambulatory Visit: Payer: Self-pay | Admitting: Family Medicine

## 2022-10-11 DIAGNOSIS — R928 Other abnormal and inconclusive findings on diagnostic imaging of breast: Secondary | ICD-10-CM

## 2022-10-12 DIAGNOSIS — E785 Hyperlipidemia, unspecified: Secondary | ICD-10-CM | POA: Diagnosis not present

## 2022-10-12 DIAGNOSIS — Z79899 Other long term (current) drug therapy: Secondary | ICD-10-CM | POA: Diagnosis not present

## 2022-10-16 DIAGNOSIS — J209 Acute bronchitis, unspecified: Secondary | ICD-10-CM | POA: Diagnosis not present

## 2022-10-25 ENCOUNTER — Ambulatory Visit
Admission: RE | Admit: 2022-10-25 | Discharge: 2022-10-25 | Disposition: A | Discharge status: Home / Self Care | Payer: Medicare Other | Source: Ambulatory Visit | Attending: Family Medicine | Admitting: Family Medicine

## 2022-10-25 DIAGNOSIS — N6489 Other specified disorders of breast: Secondary | ICD-10-CM | POA: Diagnosis not present

## 2022-10-25 DIAGNOSIS — R928 Other abnormal and inconclusive findings on diagnostic imaging of breast: Secondary | ICD-10-CM

## 2022-12-06 ENCOUNTER — Ambulatory Visit
Admission: RE | Admit: 2022-12-06 | Discharge: 2022-12-06 | Disposition: A | Discharge status: Home / Self Care | Payer: Medicare Other | Source: Ambulatory Visit | Attending: Family Medicine | Admitting: Family Medicine

## 2022-12-06 DIAGNOSIS — Z87891 Personal history of nicotine dependence: Secondary | ICD-10-CM | POA: Diagnosis not present

## 2022-12-06 DIAGNOSIS — Z122 Encounter for screening for malignant neoplasm of respiratory organs: Secondary | ICD-10-CM

## 2022-12-11 ENCOUNTER — Other Ambulatory Visit: Payer: Self-pay

## 2022-12-11 DIAGNOSIS — R911 Solitary pulmonary nodule: Secondary | ICD-10-CM

## 2022-12-11 DIAGNOSIS — Z87891 Personal history of nicotine dependence: Secondary | ICD-10-CM

## 2023-01-08 DIAGNOSIS — H524 Presbyopia: Secondary | ICD-10-CM | POA: Diagnosis not present

## 2023-01-25 ENCOUNTER — Telehealth: Payer: Self-pay | Admitting: Orthopaedic Surgery

## 2023-01-25 NOTE — Telephone Encounter (Signed)
Pt called requesting a renewal form for handicap placard. Please call pt when ready for pick up. Also pt is asking does she need a 10 year follow up from hip replacement. Please call pt about this matter at [phone].

## 2023-01-25 NOTE — Telephone Encounter (Signed)
Looks like she has done well from hip replacements.  No need for handicap from ortho standpoint

## 2023-02-26 DIAGNOSIS — N183 Chronic kidney disease, stage 3 unspecified: Secondary | ICD-10-CM | POA: Diagnosis not present

## 2023-02-26 DIAGNOSIS — E041 Nontoxic single thyroid nodule: Secondary | ICD-10-CM | POA: Diagnosis not present

## 2023-02-26 DIAGNOSIS — I700 Atherosclerosis of aorta: Secondary | ICD-10-CM | POA: Diagnosis not present

## 2023-02-26 DIAGNOSIS — I471 Supraventricular tachycardia, unspecified: Secondary | ICD-10-CM | POA: Diagnosis not present

## 2023-02-26 DIAGNOSIS — E785 Hyperlipidemia, unspecified: Secondary | ICD-10-CM | POA: Diagnosis not present

## 2023-02-26 DIAGNOSIS — E1121 Type 2 diabetes mellitus with diabetic nephropathy: Secondary | ICD-10-CM | POA: Diagnosis not present

## 2023-02-26 DIAGNOSIS — E1122 Type 2 diabetes mellitus with diabetic chronic kidney disease: Secondary | ICD-10-CM | POA: Diagnosis not present

## 2023-06-20 DIAGNOSIS — H938X3 Other specified disorders of ear, bilateral: Secondary | ICD-10-CM | POA: Diagnosis not present

## 2023-06-20 DIAGNOSIS — H6121 Impacted cerumen, right ear: Secondary | ICD-10-CM | POA: Diagnosis not present

## 2023-06-20 DIAGNOSIS — H8110 Benign paroxysmal vertigo, unspecified ear: Secondary | ICD-10-CM | POA: Diagnosis not present

## 2023-06-27 DIAGNOSIS — R42 Dizziness and giddiness: Secondary | ICD-10-CM | POA: Diagnosis not present

## 2023-06-27 DIAGNOSIS — H90A21 Sensorineural hearing loss, unilateral, right ear, with restricted hearing on the contralateral side: Secondary | ICD-10-CM | POA: Diagnosis not present

## 2023-08-29 DIAGNOSIS — E1142 Type 2 diabetes mellitus with diabetic polyneuropathy: Secondary | ICD-10-CM | POA: Diagnosis not present

## 2023-08-29 DIAGNOSIS — N183 Chronic kidney disease, stage 3 unspecified: Secondary | ICD-10-CM | POA: Diagnosis not present

## 2023-08-29 DIAGNOSIS — I129 Hypertensive chronic kidney disease with stage 1 through stage 4 chronic kidney disease, or unspecified chronic kidney disease: Secondary | ICD-10-CM | POA: Diagnosis not present

## 2023-08-29 DIAGNOSIS — E785 Hyperlipidemia, unspecified: Secondary | ICD-10-CM | POA: Diagnosis not present

## 2023-08-29 DIAGNOSIS — E1122 Type 2 diabetes mellitus with diabetic chronic kidney disease: Secondary | ICD-10-CM | POA: Diagnosis not present

## 2023-09-02 NOTE — Progress Notes (Unsigned)
Office Visit Note   Patient: Brandy Scott           Date of Birth: 1951-02-17           MRN: [id number] Visit Date: 09/03/2023              Requested by: Laurann Montana, MD [phone] [address] [place],  [place] [address] PCP: Laurann Montana, MD   Assessment & Plan: Visit Diagnoses:  1. Chronic right hip pain     Plan: Brandy Scott is a 73 year old female with right trochanteric hip pain.  Possible conditions reviewed and we will start with naproxen for couple weeks and I have provided home exercises.  X-rays are reassuring.  Follow-Up Instructions: No follow-ups on file.   Orders:  Orders Placed This Encounter  Procedures   XR HIP UNILAT W OR W/O PELVIS 2-3 VIEWS RIGHT   Meds ordered this encounter  Medications   naproxen (NAPROSYN) 500 MG tablet    Sig: Take 1 tablet (500 mg total) by mouth 2 (two) times daily with a meal.    Dispense:  30 tablet    Refill:  3      Procedures: No procedures performed   Clinical Data: No additional findings.   Subjective: Chief Complaint  Patient presents with   Right Hip - Pain    HPI Brandy Scott comes in today for evaluation of occasional right lateral hip pain for a few months.  Denies any groin pain.  Denies any injuries.  The pain will shoot down the lateral side of the thigh.  Denies any back pain or radicular symptoms. Review of Systems  Constitutional: Negative.   HENT: Negative.    Eyes: Negative.   Respiratory: Negative.    Cardiovascular: Negative.   Endocrine: Negative.   Musculoskeletal: Negative.   Neurological: Negative.   Hematological: Negative.   Psychiatric/Behavioral: Negative.    All other systems reviewed and are negative.    Objective: Vital Signs: There were no vitals taken for this visit.  Physical Exam Vitals and nursing note reviewed.  Constitutional:      Appearance: She is well-developed.  HENT:     Head: Normocephalic and atraumatic.     Nose: Nose normal.  Eyes:      Extraocular Movements: Extraocular movements intact.  Cardiovascular:     Pulses: Normal pulses.  Pulmonary:     Effort: Pulmonary effort is normal.  Abdominal:     Palpations: Abdomen is soft.  Musculoskeletal:     Cervical back: Neck supple.  Skin:    General: Skin is warm.     Capillary Refill: Capillary refill takes less than 2 seconds.  Neurological:     Mental Status: She is alert and oriented to person, place, and time. Mental status is at baseline.  Psychiatric:        Behavior: Behavior normal.        Thought Content: Thought content normal.        Judgment: Judgment normal.    Ortho Exam Brandy Scott of right hip shows no pain to the groin with hip range of motion.  She has trochanteric tenderness.  No radicular signs. Specialty Comments:  No specialty comments available.  Imaging: XR HIP UNILAT W OR W/O PELVIS 2-3 VIEWS RIGHT Result Date: 09/03/2023 X-rays of the right hip show a stable total hip arthroplasty without any complications.  Degenerative changes are noted in the lower segments of the lumbar spine.    PMFS History: Patient Active Problem List  Diagnosis Date Noted   Status post left hip replacement 02/02/2020   Hip arthritis 12/31/2014   Osteoarthritis of right hip 07/16/2013   Chronic right hip pain 04/02/2013   SVT (supraventricular tachycardia) (HCC) 05/23/2011   Chest pain 05/02/2011   Palpitations 05/02/2011   COPD, moderate, Gold B 03/15/2011   Past Medical History:  Diagnosis Date   Anxiety    Arthritis    Asthma    COPD (chronic obstructive pulmonary disease) (HCC)    Diabetes mellitus    Gout    HTN (hypertension)    Hypercholesteremia    Shortness of breath    SVT (supraventricular tachycardia)     Family History  Problem Relation Age of Onset   Colon cancer Mother    Alzheimer's disease Mother    Clotting disorder Mother        had filter placed   Hypertension Father    Diabetes Father    Parkinsonism Father    Breast  cancer Neg Hx     Past Surgical History:  Procedure Laterality Date   CARDIAC CATHETERIZATION     2010      clean   DILATION AND CURETTAGE OF UTERUS     HERNIA REPAIR     HYSTEROSCOPY     TOTAL HIP ARTHROPLASTY Right 07/16/2013   Procedure: RIGHT TOTAL HIP ARTHROPLASTY ANTERIOR APPROACH;  Surgeon: Cheral Almas, MD;  Location: [facility] OR;  Service: Orthopedics;  Laterality: Right;   TOTAL HIP ARTHROPLASTY Left 12/31/2014   Procedure: LEFT TOTAL HIP ARTHROPLASTY ANTERIOR APPROACH;  Surgeon: Tarry Kos, MD;  Location: [facility] OR;  Service: Orthopedics;  Laterality: Left;   TUBAL LIGATION     Social History   Occupational History   Occupation: unemployed   Occupation: Retired Warehouse manager work  Tobacco Use   Smoking status: Former    Current packs/day: 0.00    Average packs/day: 1 pack/day for 42.0 years (42.0 ttl pk-yrs)    Types: Cigarettes    Start date: 03/1969    Quit date: 03/2011    Years since quitting: 12.4   Smokeless tobacco: Never  Substance and Sexual Activity   Alcohol use: No   Drug use: No   Sexual activity: Yes

## 2023-09-03 ENCOUNTER — Other Ambulatory Visit (INDEPENDENT_AMBULATORY_CARE_PROVIDER_SITE_OTHER): Payer: Self-pay

## 2023-09-03 ENCOUNTER — Ambulatory Visit: Payer: Medicare Other | Admitting: Orthopaedic Surgery

## 2023-09-03 DIAGNOSIS — G8929 Other chronic pain: Secondary | ICD-10-CM

## 2023-09-03 DIAGNOSIS — M25551 Pain in right hip: Secondary | ICD-10-CM

## 2023-09-03 MED ORDER — NAPROXEN 500 MG PO TABS: 500.0000 mg | ORAL_TABLET | Freq: Two times a day (BID) | ORAL | 3 refills | Status: DC

## 2023-09-09 ENCOUNTER — Other Ambulatory Visit: Payer: Self-pay | Admitting: Family Medicine

## 2023-09-09 DIAGNOSIS — Z1231 Encounter for screening mammogram for malignant neoplasm of breast: Secondary | ICD-10-CM

## 2023-10-09 DIAGNOSIS — M10072 Idiopathic gout, left ankle and foot: Secondary | ICD-10-CM | POA: Diagnosis not present

## 2023-10-23 ENCOUNTER — Encounter: Payer: Self-pay | Admitting: Neurology

## 2023-10-23 DIAGNOSIS — E1142 Type 2 diabetes mellitus with diabetic polyneuropathy: Secondary | ICD-10-CM | POA: Diagnosis not present

## 2023-10-23 DIAGNOSIS — I129 Hypertensive chronic kidney disease with stage 1 through stage 4 chronic kidney disease, or unspecified chronic kidney disease: Secondary | ICD-10-CM | POA: Diagnosis not present

## 2023-10-23 DIAGNOSIS — R202 Paresthesia of skin: Secondary | ICD-10-CM | POA: Diagnosis not present

## 2023-10-28 ENCOUNTER — Ambulatory Visit
Admission: RE | Admit: 2023-10-28 | Discharge: 2023-10-28 | Disposition: A | Discharge status: Home / Self Care | Payer: Medicare Other | Source: Ambulatory Visit | Attending: Family Medicine | Admitting: Family Medicine

## 2023-10-28 DIAGNOSIS — Z1231 Encounter for screening mammogram for malignant neoplasm of breast: Secondary | ICD-10-CM

## 2023-11-20 ENCOUNTER — Ambulatory Visit: Admitting: Neurology

## 2023-11-20 ENCOUNTER — Encounter: Payer: Self-pay | Admitting: Neurology

## 2023-11-20 VITALS — BP 140/118 | HR 77 | Ht 67.0 in | Wt 223.0 lb

## 2023-11-20 DIAGNOSIS — M5417 Radiculopathy, lumbosacral region: Secondary | ICD-10-CM | POA: Diagnosis not present

## 2023-11-20 NOTE — Progress Notes (Signed)
 [facility] Clinic Note - Initial Visit   Date: 11/20/2023   Brandy Scott MRN: [id number] DOB: [birth year]   Dear Dr. Cliffton Asters:  Thank you for your kind referral of Brandy Scott for consultation of right leg pain. Although her history is well known to you, please allow [place] to reiterate it for the purpose of our medical record. The patient was accompanied to the clinic by self.     Brandy Scott is a 73 y.o. right-handed female with diabetes mellitus, hypertension, hyperlipidemia presenting for evaluation of right leg pain.   IMPRESSION/PLAN: Possible lumbosacral radiculopathy manifesting with episodic right leg pain.  She is doing well today and reports no pain over the past week.  We discussed the next steps if her pain were to return, which would include physical therapy and/or trial of muscle relaxer.    Return to clinic as needed  ------------------------------------------------------------- History of present illness: Starting around January 2025, she began having right sided hip pain, described as sharp pain starting at the right hip and radiate down the thigh and lower leg. Pain was very brief, lasting a few seconds, like a lighting bolt.  The pain was very severe and stop her in action.  She did not have associated back pain.  There was no specific triggers such as walking, sitting, or bending.  She had not numbness/tingling or weakness (outside of the pain spells).  Pain could occur at rest also.  She saw her orthopeadic surgeon because she had both hips replaced who did not find any abnormality of the hips.  For the past week, she has not had any of these spells where as previously it was occurring multiple times per day and night.  She did not have similar problem in the left leg.  PCP offered a trial of prednisone and gabapentin 100mg  TID (stopped due to dizziness) and she did not appreciate any improvement while taking this, but her  symptoms did resolve two weeks later.   She is retired [employer] operational officer and lives at home with her husband.  Former smoker.  She does not drink alcohol.   Out-side paper records, electronic medical record, and images have been reviewed where available and summarized as:  Lab Results  Component Value Date   ESRSEDRATE 15 12/21/2014    Past Medical History:  Diagnosis Date   Anxiety    Arthritis    Asthma    COPD (chronic obstructive pulmonary disease) (HCC)    Diabetes mellitus    Gout    HTN (hypertension)    Hypercholesteremia    Shortness of breath    SVT (supraventricular tachycardia) (HCC)     Past Surgical History:  Procedure Laterality Date   CARDIAC CATHETERIZATION     2010      clean   DILATION AND CURETTAGE OF UTERUS     HERNIA REPAIR     HYSTEROSCOPY     TOTAL HIP ARTHROPLASTY Right 07/16/2013   Procedure: RIGHT TOTAL HIP ARTHROPLASTY ANTERIOR APPROACH;  Surgeon: Cheral Almas, MD;  Location: [facility] OR;  Service: Orthopedics;  Laterality: Right;   TOTAL HIP ARTHROPLASTY Left 12/31/2014   Procedure: LEFT TOTAL HIP ARTHROPLASTY ANTERIOR APPROACH;  Surgeon: Tarry Kos, MD;  Location: [facility] OR;  Service: Orthopedics;  Laterality: Left;   TUBAL LIGATION       Medications:  Outpatient Encounter Medications as of 11/20/2023  Medication Sig   aspirin 81 MG tablet Take 81 mg by mouth daily.   metFORMIN (  GLUCOPHAGE) 500 MG tablet Take 500 mg by mouth 2 (two) times daily with a meal.   olmesartan-hydrochlorothiazide (BENICAR HCT) 40-12.5 MG tablet Take 1 tablet by mouth daily.   [DISCONTINUED] fenofibrate (TRICOR) 145 MG tablet Take 145 mg by mouth daily.  (Patient not taking: Reported on 11/20/2023)   [DISCONTINUED] naproxen (NAPROSYN) 500 MG tablet Take 1 tablet (500 mg total) by mouth 2 (two) times daily with a meal. (Patient not taking: Reported on 11/20/2023)   No facility-administered encounter medications on file as of 11/20/2023.    Allergies:  Allergies  Allergen Reactions    Lyrica [Pregabalin]     Patient states Lyrica makes her very dizzy   Statins     Cause, muscle soreness and shortness of breath    Family History: Family History  Problem Relation Age of Onset   Colon cancer Mother    Alzheimer's disease Mother    Clotting disorder Mother        had filter placed   Hypertension Father    Diabetes Father    Parkinsonism Father    Breast cancer Neg Hx     Social History: Social History   Tobacco Use   Smoking status: Former    Current packs/day: 0.00    Average packs/day: 1 pack/day for 42.0 years (42.0 ttl pk-yrs)    Types: Cigarettes    Start date: 03/1969    Quit date: 03/2011    Years since quitting: 12.6   Smokeless tobacco: Never  Substance Use Topics   Alcohol use: No   Drug use: No   Social History   Social History Narrative   Are you right handed or left handed? Right Handed    Are you currently employed ? No    What is your current occupation? Retired    Do you live at home alone? No    Who lives with you? Husband    What type of home do you live in: 1 story or 2 story? Lives in a one story home.         Vital Signs:  BP (!) 140/118   Pulse 77   Ht 5\' 7"  (1.702 m)   Wt 223 lb (101.2 kg)   SpO2 96%   BMI 34.93 kg/m    Neurological Exam: MENTAL STATUS including orientation to time, place, person, recent and remote memory, attention span and concentration, language, and fund of knowledge is normal.  Speech is not dysarthric.  CRANIAL NERVES: II:  No visual field defects.     III-IV-VI: Pupils equal round and reactive to light.  Normal conjugate, extra-ocular eye movements in all directions of gaze.  No nystagmus.  No ptosis.   V:  Normal facial sensation.    VII:  Normal facial symmetry and movements.   VIII:  Normal hearing and vestibular function.   IX-X:  Normal palatal movement.   XI:  Normal shoulder shrug and head rotation.   XII:  Normal tongue strength and range of motion, no deviation or  fasciculation.  MOTOR:  No atrophy, fasciculations or abnormal movements.  No pronator drift.   Upper Extremity:  Right  Left  Deltoid  5/5   5/5   Biceps  5/5   5/5   Triceps  5/5   5/5   Wrist extensors  5/5   5/5   Wrist flexors  5/5   5/5   Finger extensors  5/5   5/5   Finger flexors  5/5   5/5  Dorsal interossei  5/5   5/5   Abductor pollicis  5/5   5/5   Tone (Ashworth scale)  0  0   Lower Extremity:  Right  Left  Hip flexors  5/5   5/5   Hip extensors  5/5   5/5   Knee flexors  5/5   5/5   Knee extensors  5/5   5/5   Dorsiflexors  5/5   5/5   Plantarflexors  5/5   5/5   Toe extensors  5/5   5/5   Toe flexors  5/5   5/5   Tone (Ashworth scale)  0  0   MSRs:                                           Right        Left brachioradialis 2+  2+  biceps 2+  2+  triceps 2+  2+  patellar 2+  2+  ankle jerk 2+  2+  Hoffman no  no  plantar response down  down   SENSORY:  Normal and symmetric perception of light touch, pinprick, vibration, and temperature.     COORDINATION/GAIT: Normal finger-to- nose-finger.  Intact rapid alternating movements bilaterally.  Able to rise from a chair without using arms.  Gait narrow based and stable. Stressed gait intact. Unsteady with tandem gait, but able to perform.    Thank you for allowing me to participate in patient's care.  If I can answer any additional questions, I would be pleased to do so.    Sincerely,    Prisila Dlouhy K. Allena Katz, DO

## 2023-12-09 ENCOUNTER — Ambulatory Visit
Admission: RE | Admit: 2023-12-09 | Discharge: 2023-12-09 | Disposition: A | Discharge status: Home / Self Care | Source: Ambulatory Visit | Attending: Acute Care | Admitting: Acute Care

## 2023-12-09 DIAGNOSIS — Z122 Encounter for screening for malignant neoplasm of respiratory organs: Secondary | ICD-10-CM | POA: Diagnosis not present

## 2023-12-09 DIAGNOSIS — Z87891 Personal history of nicotine dependence: Secondary | ICD-10-CM

## 2024-01-01 ENCOUNTER — Other Ambulatory Visit: Payer: Self-pay

## 2024-01-01 DIAGNOSIS — Z122 Encounter for screening for malignant neoplasm of respiratory organs: Secondary | ICD-10-CM

## 2024-01-01 DIAGNOSIS — Z87891 Personal history of nicotine dependence: Secondary | ICD-10-CM

## 2024-01-14 DIAGNOSIS — H52209 Unspecified astigmatism, unspecified eye: Secondary | ICD-10-CM | POA: Diagnosis not present

## 2024-02-26 DIAGNOSIS — N183 Chronic kidney disease, stage 3 unspecified: Secondary | ICD-10-CM | POA: Diagnosis not present

## 2024-02-26 DIAGNOSIS — I129 Hypertensive chronic kidney disease with stage 1 through stage 4 chronic kidney disease, or unspecified chronic kidney disease: Secondary | ICD-10-CM | POA: Diagnosis not present

## 2024-02-26 DIAGNOSIS — E785 Hyperlipidemia, unspecified: Secondary | ICD-10-CM | POA: Diagnosis not present

## 2024-02-26 DIAGNOSIS — E1142 Type 2 diabetes mellitus with diabetic polyneuropathy: Secondary | ICD-10-CM | POA: Diagnosis not present

## 2024-04-29 DIAGNOSIS — Z79899 Other long term (current) drug therapy: Secondary | ICD-10-CM | POA: Diagnosis not present

## 2024-04-29 DIAGNOSIS — E785 Hyperlipidemia, unspecified: Secondary | ICD-10-CM | POA: Diagnosis not present

## 2024-06-15 ENCOUNTER — Encounter: Payer: Self-pay | Admitting: Radiology
